# Patient Record
Sex: Male | Born: 1935 | Race: Black or African American | Hispanic: No | Marital: Married | Smoking: Former smoker
Health system: Southern US, Community
[De-identification: ages and names within clinical notes are randomized; demographics above are authoritative.]

## PROBLEM LIST (undated history)

## (undated) DIAGNOSIS — R1311 Dysphagia, oral phase: Secondary | ICD-10-CM

## (undated) DIAGNOSIS — N289 Disorder of kidney and ureter, unspecified: Secondary | ICD-10-CM

## (undated) DIAGNOSIS — K219 Gastro-esophageal reflux disease without esophagitis: Secondary | ICD-10-CM

## (undated) DIAGNOSIS — N400 Enlarged prostate without lower urinary tract symptoms: Secondary | ICD-10-CM

## (undated) DIAGNOSIS — E559 Vitamin D deficiency, unspecified: Secondary | ICD-10-CM

## (undated) DIAGNOSIS — K5900 Constipation, unspecified: Secondary | ICD-10-CM

## (undated) DIAGNOSIS — F329 Major depressive disorder, single episode, unspecified: Secondary | ICD-10-CM

## (undated) DIAGNOSIS — E785 Hyperlipidemia, unspecified: Secondary | ICD-10-CM

## (undated) DIAGNOSIS — G4700 Insomnia, unspecified: Secondary | ICD-10-CM

## (undated) DIAGNOSIS — M6281 Muscle weakness (generalized): Secondary | ICD-10-CM

## (undated) DIAGNOSIS — R52 Pain, unspecified: Secondary | ICD-10-CM

## (undated) DIAGNOSIS — M810 Age-related osteoporosis without current pathological fracture: Secondary | ICD-10-CM

---

## 2013-11-08 ENCOUNTER — Emergency Department: Payer: Self-pay | Admitting: Emergency Medicine

## 2013-11-08 LAB — CBC
HCT: 41.5 % (ref 40.0–52.0)
HGB: 13.7 g/dL (ref 13.0–18.0)
MCH: 30.5 pg (ref 26.0–34.0)
MCHC: 33 g/dL (ref 32.0–36.0)
MCV: 92 fL (ref 80–100)
Platelet: 175 10*3/uL (ref 150–440)
RBC: 4.49 10*6/uL (ref 4.40–5.90)
RDW: 13.4 % (ref 11.5–14.5)
WBC: 6.9 10*3/uL (ref 3.8–10.6)

## 2013-11-08 LAB — COMPREHENSIVE METABOLIC PANEL
ANION GAP: 3 — AB (ref 7–16)
Albumin: 3.9 g/dL (ref 3.4–5.0)
Alkaline Phosphatase: 121 U/L — ABNORMAL HIGH
BUN: 11 mg/dL (ref 7–18)
Bilirubin,Total: 0.6 mg/dL (ref 0.2–1.0)
CALCIUM: 8.9 mg/dL (ref 8.5–10.1)
CO2: 32 mmol/L (ref 21–32)
CREATININE: 0.98 mg/dL (ref 0.60–1.30)
Chloride: 102 mmol/L (ref 98–107)
EGFR (African American): >60
EGFR (Non-African Amer.): >60
GLUCOSE: 101 mg/dL — AB (ref 65–99)
Osmolality: 273 (ref 275–301)
POTASSIUM: 3.6 mmol/L (ref 3.5–5.1)
SGOT(AST): 29 U/L (ref 15–37)
SGPT (ALT): 26 U/L (ref 12–78)
Sodium: 137 mmol/L (ref 136–145)
TOTAL PROTEIN: 9 g/dL — AB (ref 6.4–8.2)

## 2013-11-08 LAB — URINALYSIS, COMPLETE
BLOOD: NEGATIVE
Bacteria: NONE SEEN
Bilirubin,UR: NEGATIVE
Glucose,UR: NEGATIVE mg/dL (ref 0–75)
KETONE: NEGATIVE
LEUKOCYTE ESTERASE: NEGATIVE
Nitrite: NEGATIVE
PH: 8 (ref 4.5–8.0)
PROTEIN: NEGATIVE
RBC,UR: 1 /HPF (ref 0–5)
Specific Gravity: 1.004 (ref 1.003–1.030)
Squamous Epithelial: NONE SEEN

## 2013-11-08 LAB — CK TOTAL AND CKMB (NOT AT ARMC)
CK, Total: 137 U/L
CK-MB: 2.1 ng/mL (ref 0.5–3.6)

## 2013-11-08 LAB — PRO B NATRIURETIC PEPTIDE: B-TYPE NATIURETIC PEPTID: 117 pg/mL (ref 0–450)

## 2013-11-08 LAB — TROPONIN I: Troponin-I: <0.02 ng/mL

## 2014-05-17 ENCOUNTER — Emergency Department: Payer: Self-pay | Admitting: Emergency Medicine

## 2014-05-17 LAB — BASIC METABOLIC PANEL
Anion Gap: 8 (ref 7–16)
BUN: 10 mg/dL (ref 7–18)
CREATININE: 0.96 mg/dL (ref 0.60–1.30)
Calcium, Total: 8.8 mg/dL (ref 8.5–10.1)
Chloride: 100 mmol/L (ref 98–107)
Co2: 32 mmol/L (ref 21–32)
EGFR (African American): >60
EGFR (Non-African Amer.): >60
Glucose: 93 mg/dL (ref 65–99)
Osmolality: 278 (ref 275–301)
POTASSIUM: 3.6 mmol/L (ref 3.5–5.1)
Sodium: 140 mmol/L (ref 136–145)

## 2014-05-17 LAB — TROPONIN I

## 2014-05-17 LAB — CBC
HCT: 39.9 % — ABNORMAL LOW (ref 40.0–52.0)
HGB: 12.5 g/dL — ABNORMAL LOW (ref 13.0–18.0)
MCH: 29.2 pg (ref 26.0–34.0)
MCHC: 31.3 g/dL — ABNORMAL LOW (ref 32.0–36.0)
MCV: 94 fL (ref 80–100)
Platelet: 168 10*3/uL (ref 150–440)
RBC: 4.27 10*6/uL — ABNORMAL LOW (ref 4.40–5.90)
RDW: 13.3 % (ref 11.5–14.5)
WBC: 6.3 10*3/uL (ref 3.8–10.6)

## 2014-06-20 ENCOUNTER — Emergency Department: Payer: Self-pay | Admitting: Emergency Medicine

## 2014-06-20 LAB — URINALYSIS, COMPLETE
BACTERIA: NONE SEEN
BLOOD: NEGATIVE
Bilirubin,UR: NEGATIVE
Glucose,UR: NEGATIVE mg/dL (ref 0–75)
KETONE: NEGATIVE
Leukocyte Esterase: NEGATIVE
Nitrite: NEGATIVE
PH: 7 (ref 4.5–8.0)
PROTEIN: NEGATIVE
RBC,UR: NONE SEEN /HPF (ref 0–5)
Specific Gravity: 1.006 (ref 1.003–1.030)
Squamous Epithelial: <1
WBC UR: 1 /HPF (ref 0–5)

## 2014-07-14 ENCOUNTER — Ambulatory Visit: Payer: Self-pay | Admitting: Podiatry

## 2014-07-21 ENCOUNTER — Ambulatory Visit: Payer: Self-pay | Admitting: Podiatry

## 2014-09-28 ENCOUNTER — Emergency Department: Payer: Self-pay | Admitting: Emergency Medicine

## 2014-11-05 ENCOUNTER — Emergency Department
Admit: 2014-11-05 | Disposition: A | Discharge status: Home / Self Care | Payer: Self-pay | Admitting: Emergency Medicine

## 2014-11-05 LAB — URINALYSIS, COMPLETE
BACTERIA: NONE SEEN
Bilirubin,UR: NEGATIVE
GLUCOSE, UR: NEGATIVE mg/dL (ref 0–75)
Ketone: NEGATIVE
Leukocyte Esterase: NEGATIVE
NITRITE: NEGATIVE
Ph: 7 (ref 4.5–8.0)
Protein: NEGATIVE
RBC,UR: <1 /HPF (ref 0–5)
SPECIFIC GRAVITY: 1.003 (ref 1.003–1.030)

## 2014-11-05 LAB — CBC
HCT: 38.6 % — ABNORMAL LOW (ref 40.0–52.0)
HGB: 12.4 g/dL — AB (ref 13.0–18.0)
MCH: 29.9 pg (ref 26.0–34.0)
MCHC: 32.2 g/dL (ref 32.0–36.0)
MCV: 93 fL (ref 80–100)
Platelet: 158 10*3/uL (ref 150–440)
RBC: 4.16 10*6/uL — AB (ref 4.40–5.90)
RDW: 13.1 % (ref 11.5–14.5)
WBC: 6.1 10*3/uL (ref 3.8–10.6)

## 2014-11-05 LAB — BASIC METABOLIC PANEL
ANION GAP: 6 — AB (ref 7–16)
BUN: 18 mg/dL
CHLORIDE: 99 mmol/L — AB
CREATININE: 1.11 mg/dL
Calcium, Total: 9.2 mg/dL
Co2: 31 mmol/L
EGFR (African American): >60
EGFR (Non-African Amer.): >60
GLUCOSE: 107 mg/dL — AB
Potassium: 3.8 mmol/L
Sodium: 136 mmol/L

## 2014-11-05 LAB — TSH: Thyroid Stimulating Horm: 1.505 u[IU]/mL

## 2014-11-05 LAB — TROPONIN I: Troponin-I: <0.03 ng/mL

## 2015-04-29 ENCOUNTER — Other Ambulatory Visit
Admission: RE | Admit: 2015-04-29 | Discharge: 2015-04-29 | Disposition: A | Discharge status: Home / Self Care | Payer: Medicare Other | Source: Ambulatory Visit | Attending: Nurse Practitioner | Admitting: Nurse Practitioner

## 2015-04-29 DIAGNOSIS — F39 Unspecified mood [affective] disorder: Secondary | ICD-10-CM | POA: Diagnosis present

## 2015-04-29 LAB — URINALYSIS COMPLETE WITH MICROSCOPIC (ARMC ONLY)
Bilirubin Urine: NEGATIVE
GLUCOSE, UA: NEGATIVE mg/dL
Hgb urine dipstick: NEGATIVE
LEUKOCYTES UA: NEGATIVE
Nitrite: NEGATIVE
Protein, ur: NEGATIVE mg/dL
SPECIFIC GRAVITY, URINE: 1.025 (ref 1.005–1.030)
pH: 6 (ref 5.0–8.0)

## 2015-05-01 LAB — URINE CULTURE: Culture: 80000

## 2015-10-05 ENCOUNTER — Ambulatory Visit
Admission: RE | Admit: 2015-10-05 | Discharge: 2015-10-05 | Disposition: A | Discharge status: Home / Self Care | Payer: Medicare Other | Source: Ambulatory Visit | Attending: Nurse Practitioner | Admitting: Nurse Practitioner

## 2015-10-05 ENCOUNTER — Other Ambulatory Visit
Admission: RE | Admit: 2015-10-05 | Discharge: 2015-10-05 | Disposition: A | Discharge status: Home / Self Care | Payer: Medicare Other | Source: Ambulatory Visit | Attending: Family Medicine | Admitting: Family Medicine

## 2015-10-05 ENCOUNTER — Other Ambulatory Visit: Payer: Self-pay | Admitting: Nurse Practitioner

## 2015-10-05 DIAGNOSIS — R109 Unspecified abdominal pain: Secondary | ICD-10-CM

## 2015-10-05 DIAGNOSIS — N200 Calculus of kidney: Secondary | ICD-10-CM | POA: Insufficient documentation

## 2015-10-05 DIAGNOSIS — K8020 Calculus of gallbladder without cholecystitis without obstruction: Secondary | ICD-10-CM | POA: Insufficient documentation

## 2015-10-05 DIAGNOSIS — K4090 Unilateral inguinal hernia, without obstruction or gangrene, not specified as recurrent: Secondary | ICD-10-CM | POA: Diagnosis not present

## 2015-10-05 LAB — BASIC METABOLIC PANEL
ANION GAP: 6 (ref 5–15)
BUN: 26 mg/dL — AB (ref 6–20)
CHLORIDE: 98 mmol/L — AB (ref 101–111)
CO2: 32 mmol/L (ref 22–32)
CREATININE: 1.41 mg/dL — AB (ref 0.61–1.24)
Calcium: 8.8 mg/dL — ABNORMAL LOW (ref 8.9–10.3)
GFR calc Af Amer: 50 mL/min — ABNORMAL LOW (ref >60)
GFR calc non Af Amer: 43 mL/min — ABNORMAL LOW (ref >60)
Glucose, Bld: 93 mg/dL (ref 65–99)
POTASSIUM: 4.4 mmol/L (ref 3.5–5.1)
Sodium: 136 mmol/L (ref 135–145)

## 2015-10-05 LAB — CBC WITH DIFFERENTIAL/PLATELET
BASOS ABS: 0 10*3/uL (ref 0–0.1)
Basophils Relative: 1 %
Eosinophils Absolute: 0.1 10*3/uL (ref 0–0.7)
Eosinophils Relative: 2 %
HEMATOCRIT: 36.7 % — AB (ref 40.0–52.0)
Hemoglobin: 12.1 g/dL — ABNORMAL LOW (ref 13.0–18.0)
LYMPHS ABS: 1.5 10*3/uL (ref 1.0–3.6)
LYMPHS PCT: 25 %
MCH: 29.9 pg (ref 26.0–34.0)
MCHC: 32.8 g/dL (ref 32.0–36.0)
MCV: 91 fL (ref 80.0–100.0)
Monocytes Absolute: 0.6 10*3/uL (ref 0.2–1.0)
Monocytes Relative: 10 %
NEUTROS ABS: 3.9 10*3/uL (ref 1.4–6.5)
Neutrophils Relative %: 62 %
Platelets: 155 10*3/uL (ref 150–440)
RBC: 4.04 MIL/uL — AB (ref 4.40–5.90)
RDW: 12.8 % (ref 11.5–14.5)
WBC: 6.2 10*3/uL (ref 3.8–10.6)

## 2015-10-08 ENCOUNTER — Other Ambulatory Visit
Admission: RE | Admit: 2015-10-08 | Discharge: 2015-10-08 | Disposition: A | Discharge status: Home / Self Care | Payer: Medicare Other | Source: Ambulatory Visit | Attending: Family Medicine | Admitting: Family Medicine

## 2015-10-08 DIAGNOSIS — N179 Acute kidney failure, unspecified: Secondary | ICD-10-CM | POA: Diagnosis present

## 2015-10-08 DIAGNOSIS — G301 Alzheimer's disease with late onset: Secondary | ICD-10-CM | POA: Insufficient documentation

## 2015-10-08 DIAGNOSIS — I214 Non-ST elevation (NSTEMI) myocardial infarction: Secondary | ICD-10-CM | POA: Diagnosis present

## 2015-10-08 DIAGNOSIS — I10 Essential (primary) hypertension: Secondary | ICD-10-CM | POA: Insufficient documentation

## 2015-10-08 LAB — URINALYSIS COMPLETE WITH MICROSCOPIC (ARMC ONLY)
BILIRUBIN URINE: NEGATIVE
Bacteria, UA: NONE SEEN
Glucose, UA: NEGATIVE mg/dL
HGB URINE DIPSTICK: NEGATIVE
KETONES UR: NEGATIVE mg/dL
Leukocytes, UA: NEGATIVE
NITRITE: NEGATIVE
PROTEIN: NEGATIVE mg/dL
Specific Gravity, Urine: 1.009 (ref 1.005–1.030)
Squamous Epithelial / LPF: NONE SEEN
pH: 5 (ref 5.0–8.0)

## 2015-10-10 LAB — URINE CULTURE

## 2015-12-16 ENCOUNTER — Other Ambulatory Visit
Admission: RE | Admit: 2015-12-16 | Discharge: 2015-12-16 | Disposition: A | Discharge status: Home / Self Care | Payer: Medicare Other | Source: Ambulatory Visit | Attending: Family Medicine | Admitting: Family Medicine

## 2015-12-16 DIAGNOSIS — N179 Acute kidney failure, unspecified: Secondary | ICD-10-CM | POA: Insufficient documentation

## 2015-12-16 DIAGNOSIS — I214 Non-ST elevation (NSTEMI) myocardial infarction: Secondary | ICD-10-CM | POA: Insufficient documentation

## 2015-12-16 DIAGNOSIS — I10 Essential (primary) hypertension: Secondary | ICD-10-CM | POA: Diagnosis present

## 2015-12-16 DIAGNOSIS — G301 Alzheimer's disease with late onset: Secondary | ICD-10-CM | POA: Insufficient documentation

## 2015-12-16 LAB — TROPONIN I

## 2015-12-16 LAB — CBC WITH DIFFERENTIAL/PLATELET
BASOS ABS: 0 10*3/uL (ref 0–0.1)
BASOS PCT: 0 %
EOS ABS: 0.4 10*3/uL (ref 0–0.7)
EOS PCT: 6 %
HCT: 31.6 % — ABNORMAL LOW (ref 40.0–52.0)
Hemoglobin: 10.4 g/dL — ABNORMAL LOW (ref 13.0–18.0)
LYMPHS PCT: 23 %
Lymphs Abs: 1.7 10*3/uL (ref 1.0–3.6)
MCH: 30.3 pg (ref 26.0–34.0)
MCHC: 32.8 g/dL (ref 32.0–36.0)
MCV: 92.4 fL (ref 80.0–100.0)
MONO ABS: 0.8 10*3/uL (ref 0.2–1.0)
Monocytes Relative: 11 %
Neutro Abs: 4.5 10*3/uL (ref 1.4–6.5)
Neutrophils Relative %: 60 %
PLATELETS: 148 10*3/uL — AB (ref 150–440)
RBC: 3.42 MIL/uL — AB (ref 4.40–5.90)
RDW: 12.8 % (ref 11.5–14.5)
WBC: 7.4 10*3/uL (ref 3.8–10.6)

## 2015-12-16 LAB — URINALYSIS COMPLETE WITH MICROSCOPIC (ARMC ONLY)
Bilirubin Urine: NEGATIVE
GLUCOSE, UA: NEGATIVE mg/dL
KETONES UR: NEGATIVE mg/dL
Leukocytes, UA: NEGATIVE
Nitrite: NEGATIVE
PROTEIN: NEGATIVE mg/dL
SPECIFIC GRAVITY, URINE: 1.011 (ref 1.005–1.030)
WBC, UA: NONE SEEN WBC/hpf (ref 0–5)
pH: 5 (ref 5.0–8.0)

## 2015-12-16 LAB — BASIC METABOLIC PANEL
ANION GAP: 9 (ref 5–15)
BUN: 85 mg/dL — ABNORMAL HIGH (ref 6–20)
CHLORIDE: 101 mmol/L (ref 101–111)
CO2: 27 mmol/L (ref 22–32)
Calcium: 8.6 mg/dL — ABNORMAL LOW (ref 8.9–10.3)
Creatinine, Ser: 3.19 mg/dL — ABNORMAL HIGH (ref 0.61–1.24)
GFR calc Af Amer: 19 mL/min — ABNORMAL LOW (ref >60)
GFR, EST NON AFRICAN AMERICAN: 16 mL/min — AB (ref >60)
GLUCOSE: 112 mg/dL — AB (ref 65–99)
POTASSIUM: 4.4 mmol/L (ref 3.5–5.1)
Sodium: 137 mmol/L (ref 135–145)

## 2015-12-16 LAB — BRAIN NATRIURETIC PEPTIDE: B NATRIURETIC PEPTIDE 5: 88 pg/mL (ref 0.0–100.0)

## 2015-12-16 LAB — CKMB (ARMC ONLY): CK, MB: 6.4 ng/mL — AB (ref 0.5–5.0)

## 2015-12-18 LAB — URINE CULTURE: Culture: NO GROWTH

## 2016-03-10 ENCOUNTER — Other Ambulatory Visit
Admission: RE | Admit: 2016-03-10 | Discharge: 2016-03-10 | Disposition: A | Discharge status: Home / Self Care | Payer: Medicare Other | Source: Ambulatory Visit | Attending: Family Medicine | Admitting: Family Medicine

## 2016-03-10 DIAGNOSIS — G301 Alzheimer's disease with late onset: Secondary | ICD-10-CM | POA: Insufficient documentation

## 2016-03-10 DIAGNOSIS — I214 Non-ST elevation (NSTEMI) myocardial infarction: Secondary | ICD-10-CM | POA: Insufficient documentation

## 2016-03-10 DIAGNOSIS — N179 Acute kidney failure, unspecified: Secondary | ICD-10-CM | POA: Insufficient documentation

## 2016-03-10 DIAGNOSIS — I10 Essential (primary) hypertension: Secondary | ICD-10-CM | POA: Insufficient documentation

## 2016-03-10 LAB — BASIC METABOLIC PANEL
ANION GAP: 9 (ref 5–15)
BUN: 23 mg/dL — ABNORMAL HIGH (ref 6–20)
CO2: 27 mmol/L (ref 22–32)
Calcium: 8.8 mg/dL — ABNORMAL LOW (ref 8.9–10.3)
Chloride: 101 mmol/L (ref 101–111)
Creatinine, Ser: 1.36 mg/dL — ABNORMAL HIGH (ref 0.61–1.24)
GFR, EST AFRICAN AMERICAN: 52 mL/min — AB (ref >60)
GFR, EST NON AFRICAN AMERICAN: 45 mL/min — AB (ref >60)
Glucose, Bld: 116 mg/dL — ABNORMAL HIGH (ref 65–99)
POTASSIUM: 4.1 mmol/L (ref 3.5–5.1)
SODIUM: 137 mmol/L (ref 135–145)

## 2016-03-10 LAB — CBC WITH DIFFERENTIAL/PLATELET
BASOS ABS: 0 10*3/uL (ref 0–0.1)
Basophils Relative: 1 %
EOS ABS: 0.1 10*3/uL (ref 0–0.7)
EOS PCT: 1 %
HCT: 31.2 % — ABNORMAL LOW (ref 40.0–52.0)
Hemoglobin: 10.8 g/dL — ABNORMAL LOW (ref 13.0–18.0)
LYMPHS PCT: 20 %
Lymphs Abs: 1.9 10*3/uL (ref 1.0–3.6)
MCH: 31.8 pg (ref 26.0–34.0)
MCHC: 34.5 g/dL (ref 32.0–36.0)
MCV: 92.1 fL (ref 80.0–100.0)
Monocytes Absolute: 0.9 10*3/uL (ref 0.2–1.0)
Monocytes Relative: 10 %
Neutro Abs: 6.6 10*3/uL — ABNORMAL HIGH (ref 1.4–6.5)
Neutrophils Relative %: 68 %
PLATELETS: 150 10*3/uL (ref 150–440)
RBC: 3.39 MIL/uL — AB (ref 4.40–5.90)
RDW: 13.1 % (ref 11.5–14.5)
WBC: 9.5 10*3/uL (ref 3.8–10.6)

## 2016-05-19 ENCOUNTER — Other Ambulatory Visit
Admission: RE | Admit: 2016-05-19 | Discharge: 2016-05-19 | Disposition: A | Discharge status: Home / Self Care | Payer: Medicare Other | Source: Ambulatory Visit | Attending: Family Medicine | Admitting: Family Medicine

## 2016-05-19 DIAGNOSIS — N390 Urinary tract infection, site not specified: Secondary | ICD-10-CM | POA: Insufficient documentation

## 2016-05-19 LAB — URINALYSIS COMPLETE WITH MICROSCOPIC (ARMC ONLY)
BACTERIA UA: NONE SEEN
Bilirubin Urine: NEGATIVE
GLUCOSE, UA: NEGATIVE mg/dL
Hgb urine dipstick: NEGATIVE
Ketones, ur: NEGATIVE mg/dL
Leukocytes, UA: NEGATIVE
Nitrite: NEGATIVE
PROTEIN: NEGATIVE mg/dL
Specific Gravity, Urine: 1.012 (ref 1.005–1.030)
Squamous Epithelial / LPF: NONE SEEN
pH: 7 (ref 5.0–8.0)

## 2016-05-20 LAB — URINE CULTURE: CULTURE: NO GROWTH

## 2016-08-20 ENCOUNTER — Ambulatory Visit: Payer: Self-pay | Admitting: Urology

## 2016-09-03 ENCOUNTER — Ambulatory Visit (INDEPENDENT_AMBULATORY_CARE_PROVIDER_SITE_OTHER): Payer: Medicare Other | Admitting: Urology

## 2016-09-03 VITALS — Ht 69.0 in | Wt 214.9 lb

## 2016-09-03 DIAGNOSIS — R350 Frequency of micturition: Secondary | ICD-10-CM

## 2016-09-03 LAB — BLADDER SCAN AMB NON-IMAGING: Scan Result: 42

## 2016-09-03 MED ORDER — MIRABEGRON ER 50 MG PO TB24: 50.0000 mg | ORAL_TABLET | Freq: Every day | ORAL | 11 refills | Status: DC

## 2016-09-03 NOTE — Progress Notes (Signed)
09/03/2016 2:52 PM   Jose Allen 1925/11/06 [id number]  Referring provider: Keane Police, MD [id number] [place], [place] [address]  Chief Complaint  Patient presents with  . New Patient (Initial Visit)    urinary frequency    HPI: 83 year old African-American male who presents today from a nursing facility with a past medical history of dementia who is seen today for further evaluation of overactive bladder and urinary incontinence.  The patient states that he has a strong stream, but has a very frequent bladder. He is unable to resist the urges. He uses about 2 or 3 depends per day. He also has associated fecal incontinence. He has no history of urinary tract infections. He does have a history of BPH, he had a previous urologist and was taking Terazosin and finasteride. His tears as it has been discontinued, he continues on Avodart.     PMH: No past medical history on file.  Surgical History: No past surgical history on file.  Home Medications:  Allergies as of 09/03/2016      Reactions   Terazosin    Other reaction(s): Other (See Comments) NEAR CARDIAC ARREST      Medication List       Accurate as of 09/03/16  2:52 PM. Always use your most recent med list.          acetaminophen 325 MG tablet Commonly known as:  TYLENOL Take by mouth.   aspirin EC 81 MG tablet Take by mouth.   carvedilol 12.5 MG tablet Commonly known as:  COREG Take by mouth.   escitalopram 5 MG tablet Commonly known as:  LEXAPRO Take by mouth.   isosorbide mononitrate 60 MG 24 hr tablet Commonly known as:  IMDUR Take by mouth.   lactulose 10 GM/15ML solution Commonly known as:  CHRONULAC Take by mouth.   mirabegron ER 50 MG Tb24 tablet Commonly known as:  MYRBETRIQ Take 1 tablet (50 mg total) by mouth daily.   torsemide 20 MG tablet Commonly known as:  DEMADEX Take by mouth.   VITAMIN D-1000 MAX ST 1000 units tablet Generic drug:   Cholecalciferol Take by mouth.       Allergies:  Allergies  Allergen Reactions  . Terazosin     Other reaction(s): Other (See Comments) NEAR CARDIAC ARREST    Family History: No family history on file.  Social History:  has no tobacco, alcohol, and drug history on file.  ROS: UROLOGY Frequent Urination?: Yes Hard to postpone urination?: Yes Burning/pain with urination?: No Get up Allen night to urinate?: Yes Leakage of urine?: Yes Urine stream starts and stops?: No Trouble starting stream?: No Do you have to strain to urinate?: No Blood in urine?: No Urinary tract infection?: No Sexually transmitted disease?: No Injury to kidneys or bladder?: No Painful intercourse?: No Weak stream?: No Erection problems?: No Penile pain?: No                                      Physical Exam: Ht 5\' 9"  (1.753 m)   Wt 97.5 kg (214 lb 14.4 oz)   BMI 31.74 kg/m   Constitutional:  Alert and oriented, No acute distress. HEENT: Jose Allen, moist mucus membranes.  Trachea midline, no masses. Cardiovascular: No clubbing, cyanosis, or edema. Respiratory: Normal respiratory effort, no increased work of breathing. GI: Abdomen is soft, nontender, nondistended, no abdominal masses Skin: No rashes, bruises or  suspicious lesions. Lymph: No cervical or inguinal adenopathy. Neurologic: Grossly intact, no focal deficits, moving all 4 extremities. Psychiatric: Normal mood and affect.  Laboratory Data: Lab Results  Component Value Date   WBC 9.5 03/10/2016   HGB 10.8 (L) 03/10/2016   HCT 31.2 (L) 03/10/2016   MCV 92.1 03/10/2016   PLT 150 03/10/2016    Lab Results  Component Value Date   CREATININE 1.36 (H) 03/10/2016    No results found for: PSA  No results found for: TESTOSTERONE  No results found for: HGBA1C  Urinalysis    Component Value Date/Time   COLORURINE YELLOW (A) 05/19/2016 0800   APPEARANCEUR CLEAR (A) 05/19/2016 0800   APPEARANCEUR Clear  11/05/2014 0812   LABSPEC 1.012 05/19/2016 0800   LABSPEC 1.003 11/05/2014 0812   PHURINE 7.0 05/19/2016 0800   GLUCOSEU NEGATIVE 05/19/2016 0800   GLUCOSEU Negative 11/05/2014 0812   HGBUR NEGATIVE 05/19/2016 0800   BILIRUBINUR NEGATIVE 05/19/2016 0800   BILIRUBINUR Negative 11/05/2014 0812   KETONESUR NEGATIVE 05/19/2016 0800   PROTEINUR NEGATIVE 05/19/2016 0800   NITRITE NEGATIVE 05/19/2016 0800   LEUKOCYTESUR NEGATIVE 05/19/2016 0800   LEUKOCYTESUR Negative 11/05/2014 16100812    Pertinent Imaging: PVR: 48 mL's  Assessment & Plan:  The patient has overactive bladder with associated urge urinary incontinence. This is certainly a multifactorial problem compounded by his diuretics as well as his dementia.  1. Urinary frequency I recommended that the patient be placed on a timed voiding schedule of every 4 hours. I also encouraged the patient dictated recorder. Further, we have started the patient on myrbetriq 50 mg daily. He will follow up with us in 1 month to ensure that his symptoms have improved.  - Urinalysis, Complete - Bladder Scan (Post Void Residual) in office   No Follow-up on file.  Crist FatHERRICK, Jose Seda W, MD  [facility] [address], [address] [place], [place] [address] [phone]

## 2016-09-04 LAB — URINALYSIS, COMPLETE
BILIRUBIN UA: NEGATIVE
Glucose, UA: NEGATIVE
KETONES UA: NEGATIVE
Leukocytes, UA: NEGATIVE
NITRITE UA: NEGATIVE
Protein, UA: NEGATIVE
SPEC GRAV UA: 1.01 (ref 1.005–1.030)
Urobilinogen, Ur: 0.2 mg/dL (ref 0.2–1.0)
pH, UA: 6.5 (ref 5.0–7.5)

## 2016-09-04 LAB — MICROSCOPIC EXAMINATION
Bacteria, UA: NONE SEEN
Epithelial Cells (non renal): NONE SEEN /hpf (ref 0–10)
WBC UA: NONE SEEN /HPF (ref 0–?)

## 2016-09-24 ENCOUNTER — Ambulatory Visit: Payer: Medicare Other | Admitting: Podiatry

## 2016-10-01 ENCOUNTER — Ambulatory Visit: Payer: Self-pay

## 2016-10-01 ENCOUNTER — Ambulatory Visit: Payer: Self-pay | Admitting: Urology

## 2016-10-08 ENCOUNTER — Ambulatory Visit: Payer: Medicare Other | Admitting: Podiatry

## 2016-10-15 ENCOUNTER — Ambulatory Visit: Payer: Self-pay

## 2016-10-29 ENCOUNTER — Ambulatory Visit: Payer: Medicare Other | Admitting: Podiatry

## 2016-10-30 ENCOUNTER — Ambulatory Visit: Payer: Medicare Other | Admitting: Podiatry

## 2016-11-18 ENCOUNTER — Ambulatory Visit: Payer: Medicare Other | Admitting: Podiatry

## 2017-01-14 ENCOUNTER — Emergency Department: Payer: Medicare Other

## 2017-01-14 ENCOUNTER — Emergency Department
Admission: EM | Admit: 2017-01-14 | Discharge: 2017-01-14 | Disposition: A | Discharge status: Skilled Nursing Facility | Payer: Medicare Other | Attending: Emergency Medicine | Admitting: Emergency Medicine

## 2017-01-14 ENCOUNTER — Encounter: Payer: Self-pay | Admitting: Intensive Care

## 2017-01-14 DIAGNOSIS — W1830XA Fall on same level, unspecified, initial encounter: Secondary | ICD-10-CM | POA: Insufficient documentation

## 2017-01-14 DIAGNOSIS — Y939 Activity, unspecified: Secondary | ICD-10-CM | POA: Insufficient documentation

## 2017-01-14 DIAGNOSIS — Z87891 Personal history of nicotine dependence: Secondary | ICD-10-CM | POA: Diagnosis not present

## 2017-01-14 DIAGNOSIS — W19XXXA Unspecified fall, initial encounter: Secondary | ICD-10-CM

## 2017-01-14 DIAGNOSIS — Y999 Unspecified external cause status: Secondary | ICD-10-CM | POA: Diagnosis not present

## 2017-01-14 DIAGNOSIS — Z79899 Other long term (current) drug therapy: Secondary | ICD-10-CM | POA: Insufficient documentation

## 2017-01-14 DIAGNOSIS — Y92121 Bathroom in nursing home as the place of occurrence of the external cause: Secondary | ICD-10-CM | POA: Diagnosis not present

## 2017-01-14 DIAGNOSIS — R079 Chest pain, unspecified: Secondary | ICD-10-CM | POA: Diagnosis not present

## 2017-01-14 DIAGNOSIS — S0990XA Unspecified injury of head, initial encounter: Secondary | ICD-10-CM | POA: Insufficient documentation

## 2017-01-14 HISTORY — DX: Benign prostatic hyperplasia without lower urinary tract symptoms: N40.0

## 2017-01-14 HISTORY — DX: Disorder of kidney and ureter, unspecified: N28.9

## 2017-01-14 HISTORY — DX: Gastro-esophageal reflux disease without esophagitis: K21.9

## 2017-01-14 HISTORY — DX: Dysphagia, oral phase: R13.11

## 2017-01-14 HISTORY — DX: Hyperlipidemia, unspecified: E78.5

## 2017-01-14 HISTORY — DX: Vitamin D deficiency, unspecified: E55.9

## 2017-01-14 HISTORY — DX: Major depressive disorder, single episode, unspecified: F32.9

## 2017-01-14 HISTORY — DX: Insomnia, unspecified: G47.00

## 2017-01-14 HISTORY — DX: Pain, unspecified: R52

## 2017-01-14 HISTORY — DX: Muscle weakness (generalized): M62.81

## 2017-01-14 HISTORY — DX: Age-related osteoporosis without current pathological fracture: M81.0

## 2017-01-14 HISTORY — DX: Constipation, unspecified: K59.00

## 2017-01-14 LAB — CBC WITH DIFFERENTIAL/PLATELET
BASOS ABS: 0.1 10*3/uL (ref 0–0.1)
Basophils Relative: 1 %
Eosinophils Absolute: 0.2 10*3/uL (ref 0–0.7)
Eosinophils Relative: 3 %
HCT: 31.2 % — ABNORMAL LOW (ref 40.0–52.0)
HEMOGLOBIN: 10.3 g/dL — AB (ref 13.0–18.0)
LYMPHS PCT: 17 %
Lymphs Abs: 1.5 10*3/uL (ref 1.0–3.6)
MCH: 30.2 pg (ref 26.0–34.0)
MCHC: 32.8 g/dL (ref 32.0–36.0)
MCV: 92.1 fL (ref 80.0–100.0)
MONOS PCT: 10 %
Monocytes Absolute: 0.9 10*3/uL (ref 0.2–1.0)
NEUTROS ABS: 6 10*3/uL (ref 1.4–6.5)
Neutrophils Relative %: 69 %
Platelets: 174 10*3/uL (ref 150–440)
RBC: 3.39 MIL/uL — ABNORMAL LOW (ref 4.40–5.90)
RDW: 13.4 % (ref 11.5–14.5)
WBC: 8.7 10*3/uL (ref 3.8–10.6)

## 2017-01-14 LAB — COMPREHENSIVE METABOLIC PANEL
ALT: 17 U/L (ref 17–63)
AST: 22 U/L (ref 15–41)
Albumin: 3.1 g/dL — ABNORMAL LOW (ref 3.5–5.0)
Alkaline Phosphatase: 51 U/L (ref 38–126)
Anion gap: 8 (ref 5–15)
BILIRUBIN TOTAL: 0.7 mg/dL (ref 0.3–1.2)
BUN: 25 mg/dL — AB (ref 6–20)
CO2: 33 mmol/L — ABNORMAL HIGH (ref 22–32)
Calcium: 8.7 mg/dL — ABNORMAL LOW (ref 8.9–10.3)
Chloride: 97 mmol/L — ABNORMAL LOW (ref 101–111)
Creatinine, Ser: 1.3 mg/dL — ABNORMAL HIGH (ref 0.61–1.24)
GFR, EST AFRICAN AMERICAN: 54 mL/min — AB (ref >60)
GFR, EST NON AFRICAN AMERICAN: 47 mL/min — AB (ref >60)
Glucose, Bld: 118 mg/dL — ABNORMAL HIGH (ref 65–99)
POTASSIUM: 3.8 mmol/L (ref 3.5–5.1)
Sodium: 138 mmol/L (ref 135–145)
TOTAL PROTEIN: 6.7 g/dL (ref 6.5–8.1)

## 2017-01-14 LAB — TROPONIN I

## 2017-01-14 NOTE — ED Provider Notes (Signed)
[facility] Emergency Department Provider Note   ____________________________________________    I have reviewed the triage vital signs and the nursing notes.   HISTORY  Chief Complaint Fall  History limited by dementia   HPI Jose Allen is a 83 y.o. male who presents after an unwitnessed fall. Patient reportedly made his way into the employee bathroom at [facility] and fell, staff had to remove the door to get to him because he had locked himself in. Apparently he complained of chest pain briefly but none now. He states he feels well and has no complaints currently. Family reports abrasion to his head but no blood thinners.   Past Medical History:  Diagnosis Date  . Age-related osteoporosis without current pathological fracture   . Benign prostatic hyperplasia without lower urinary tract symptoms   . Constipation   . Dysphagia, oral phase   . Gastro-esophageal reflux disease without esophagitis   . Hyperlipidemia   . Insomnia, unspecified   . Major depressive disorder, single episode, unspecified   . Muscle weakness (generalized)   . Pain, unspecified   . Renal disorder   . Vitamin D deficiency, unspecified     There are no active problems to display for this patient.   History reviewed. No pertinent surgical history.  Prior to Admission medications   Medication Sig Start Date End Date Taking? Authorizing Provider  acetaminophen (TYLENOL) 325 MG tablet Take 650 mg by mouth every 8 (eight) hours as needed for mild pain, moderate pain or fever.    Yes [provider]  acetaminophen (TYLENOL) 500 MG tablet Take 1,000 mg by mouth 2 (two) times daily.   Yes [provider]  carvedilol (COREG) 25 MG tablet Take 25 mg by mouth 2 (two) times daily.   Yes [provider]  cholecalciferol (VITAMIN D) 1000 units tablet Take 1,000 Units by mouth daily.   Yes [provider]  cloNIDine (CATAPRES) 0.1 MG  tablet Take 0.1 mg by mouth daily as needed. For bp >170.   Yes [provider]  escitalopram (LEXAPRO) 10 MG tablet Take 10 mg by mouth daily.   Yes [provider]  finasteride (PROSCAR) 5 MG tablet Take 5 mg by mouth daily.   Yes [provider]  isosorbide mononitrate (IMDUR) 60 MG 24 hr tablet Take 90 mg by mouth daily.   Yes [provider]  lactulose (CHRONULAC) 10 GM/15ML solution Take 20 g by mouth every other day.    Yes [provider]  mineral oil-hydrophilic petrolatum (AQUAPHOR) ointment Apply 1 application topically at bedtime. Apply to bilateral lower legs and feet   Yes [provider]  mirabegron ER (MYRBETRIQ) 50 MG TB24 tablet Take 1 tablet (50 mg total) by mouth daily. 09/03/16  Yes Crist Fat, MD  polyethylene glycol [facility] / Ethelene Hal) packet Take 17 g by mouth daily.   Yes [provider]  torsemide (DEMADEX) 20 MG tablet Take 60 mg by mouth daily.    Yes [provider]     Allergies Terazosin; Enalapril; Flomax [tamsulosin hcl]; Hydrochlorothiazide; and Metoprolol  History reviewed. No pertinent family history.  Social History Social History  Substance Use Topics  . Smoking status: Former Smoker    Types: Pipe  . Smokeless tobacco: Never Used  . Alcohol use Not on file    Review of Systems  Constitutional: No fever/chills Eyes: No visual changes.  ENT: No neck pain Cardiovascular: Denies chest pain. Respiratory: Denies shortness  of breath. Gastrointestinal: No abdominal pain.  No nausea, no vomiting.   Genitourinary: Negative for dysuria. Musculoskeletal: Negative for back pain. Skin:abrasion to head Neurological: Negative for headaches or weakness   ____________________________________________   PHYSICAL EXAM:  VITAL SIGNS: ED Triage Vitals  Enc Vitals Group     BP 01/14/17 1409 (!) 141/72     Pulse Rate 01/14/17 1409 (!) 53     Resp 01/14/17 1409 12     Temp  01/14/17 1409 97.5 F (36.4 C)     Temp Source 01/14/17 1409 Oral     SpO2 01/14/17 1409 96 %     Weight 01/14/17 1410 102.1 kg (225 lb)     Height 01/14/17 1410 1.676 m (5\' 6" )     Head Circumference --      Peak Flow --      Pain Score --      Pain Loc --      Pain Edu? --      Excl. in GC? --     Constitutional: Alert. No acute distress. Pleasant and interactive Eyes: Conjunctivae are normal.  Head: Atraumatic. Nose: No congestion/rhinnorhea. Mouth/Throat: Mucous membranes are moist.   Neck:  Painless ROM, no vertebral tenderness to palpation,no pain with axial load Cardiovascular: Normal rate, regular rhythm. Grossly normal heart sounds.  Good peripheral circulation. Respiratory: Normal respiratory effort.  No retractions. Lungs CTAB. Gastrointestinal: Soft and nontender. No distention.  No CVA tenderness. Genitourinary: deferred Musculoskeletal: No lower extremity tenderness nor edema.  Warm and well perfused. Full range of motion with normal strength. No pain with axial load on either hip. No tenderness to palpation of the pelvis Neurologic:  Normal speech and language. No gross focal neurologic deficits are appreciated.  Skin:  Skin is warm, dry and intact. No rash noted. Psychiatric: Mood and affect are normal. Speech and behavior are normal.  ____________________________________________   LABS (all labs ordered are listed, but only abnormal results are displayed)  Labs Reviewed  CBC WITH DIFFERENTIAL/PLATELET - Abnormal; Notable for the following:       Result Value   RBC 3.39 (*)    Hemoglobin 10.3 (*)    HCT 31.2 (*)    All other components within normal limits  TROPONIN I  COMPREHENSIVE METABOLIC PANEL   ____________________________________________  EKG  ED ECG REPORT I, Jene Every, the attending physician, personally viewed and interpreted this ECG.  Date: 01/14/2017  Rate: 54 Rhythm: sinus bradycardia QRS Axis: normal Intervals: normal ST/T  Wave abnormalities: normal Narrative Interpretation: unremarkable  ____________________________________________  RADIOLOGY  CT head pending ____________________________________________   PROCEDURES  Procedure(s) performed: No    Critical Care performed: No ____________________________________________   INITIAL IMPRESSION / ASSESSMENT AND PLAN / ED COURSE  Pertinent labs & imaging results that were available during my care of the patient were reviewed by me and considered in my medical decision making (see chart for details).  Patient well-appearing and in no acute distress. No evidence of acute injury besides mild abrasion to the scalp. We will obtain images, labs.  ----------------------------------------- 3:22 PM on 01/14/2017 -----------------------------------------  Labs thus far are normal, pending CMP and CT head.  I have asked Dr. Lamont Snowball to f/u on CT and CMP, if normal patient appropriate for discharge.    ____________________________________________   FINAL CLINICAL IMPRESSION(S) / ED DIAGNOSES  Final diagnoses:  Minor head injury, initial encounter  Fall, initial encounter      NEW MEDICATIONS STARTED DURING THIS VISIT:  New Prescriptions  No medications on file     Note:  This document was prepared using Dragon voice recognition software and may include unintentional dictation errors.    Jene EveryKinner, Berneta Sconyers, MD 01/14/17 1524

## 2017-01-14 NOTE — Discharge Instructions (Signed)
Please follow-up with your primary care physician as needed and return to the emergency department for any concerns.  It was a pleasure to take care of you today, and thank you for coming to our emergency department.  If you have any questions or concerns before leaving please ask the nurse to grab me and I'm more than happy to go through your aftercare instructions again.  If you were prescribed any opioid pain medication today such as Norco, Vicodin, Percocet, morphine, hydrocodone, or oxycodone please make sure you do not drive when you are taking this medication as it can alter your ability to drive safely.  If you have any concerns once you are home that you are not improving or are in fact getting worse before you can make it to your follow-up appointment, please do not hesitate to call [phone] and come back for further evaluation.  Merrily Brittle MD  Results for orders placed or performed during the hospital encounter of 01/14/17  Troponin I  Result Value Ref Range   Troponin I <0.03 <0.03 ng/mL  CBC with Differential  Result Value Ref Range   WBC 8.7 3.8 - 10.6 K/uL   RBC 3.39 (L) 4.40 - 5.90 MIL/uL   Hemoglobin 10.3 (L) 13.0 - 18.0 g/dL   HCT 16.1 (L) 09.6 - 04.5 %   MCV 92.1 80.0 - 100.0 fL   MCH 30.2 26.0 - 34.0 pg   MCHC 32.8 32.0 - 36.0 g/dL   RDW 40.9 81.1 - 91.4 %   Platelets 174 150 - 440 K/uL   Neutrophils Relative % 69 %   Neutro Abs 6.0 1.4 - 6.5 K/uL   Lymphocytes Relative 17 %   Lymphs Abs 1.5 1.0 - 3.6 K/uL   Monocytes Relative 10 %   Monocytes Absolute 0.9 0.2 - 1.0 K/uL   Eosinophils Relative 3 %   Eosinophils Absolute 0.2 0 - 0.7 K/uL   Basophils Relative 1 %   Basophils Absolute 0.1 0 - 0.1 K/uL  Comprehensive metabolic panel  Result Value Ref Range   Sodium 138 135 - 145 mmol/L   Potassium 3.8 3.5 - 5.1 mmol/L   Chloride 97 (L) 101 - 111 mmol/L   CO2 33 (H) 22 - 32 mmol/L   Glucose, Bld 118 (H) 65 - 99 mg/dL   BUN 25 (H) 6 - 20 mg/dL   Creatinine, Ser  7.82 (H) 0.61 - 1.24 mg/dL   Calcium 8.7 (L) 8.9 - 10.3 mg/dL   Total Protein 6.7 6.5 - 8.1 g/dL   Albumin 3.1 (L) 3.5 - 5.0 g/dL   AST 22 15 - 41 U/L   ALT 17 17 - 63 U/L   Alkaline Phosphatase 51 38 - 126 U/L   Total Bilirubin 0.7 0.3 - 1.2 mg/dL   GFR calc non Af Amer 47 (L) >60 mL/min   GFR calc Af Amer 54 (L) >60 mL/min   Anion gap 8 5 - 15   Ct Head Wo Contrast  Result Date: 01/14/2017 CLINICAL DATA:  Unwitnessed fall in the bathroom. Abrasion to the back of the head. EXAM: CT HEAD WITHOUT CONTRAST TECHNIQUE: Contiguous axial images were obtained from the base of the skull through the vertex without intravenous contrast. COMPARISON:  11/08/2013 FINDINGS: Brain: There is generalized brain atrophy. There is an old left parietal vertex cortical and subcortical infarction. There chronic small-vessel ischemic changes of the hemispheric deep white matter. No sign of acute infarction, mass lesion, hemorrhage, hydrocephalus or extra-axial collection. Vascular: No  abnormal vascular finding. Skull: No fracture or focal lesion. Sinuses/Orbits: Clear/ normal Other: None significant IMPRESSION: No acute or traumatic finding. Atrophy and chronic small vessel ischemic change of the white matter. 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 Electronically Signed   By: Paulina FusiMark  Shogry M.D.   On: 01/14/2017 15:18

## 2017-01-14 NOTE — ED Provider Notes (Signed)
Care signed over from Dr. Kinner pending results oCyril Loosenf head CT and chemistry. Fortunately the CT scan is negative and the chemistry is negative aside from stable chronic kidney disease. The patient is behaving normally and is stable for discharge with his daughter.   Merrily Brittleifenbark, Serah Nicoletti, MD 01/14/17 [id number]

## 2017-01-14 NOTE — ED Triage Notes (Addendum)
Patient arrived from [facility] for unwitnessed fall in bathroom. Abrasion noted to back of head. EMS reports patient c/o chest pain prior to arrival to ER. Patient has no complaints at this time. BAseline ambulatory with no cane or walker. Does not take blood thinners

## 2017-03-23 ENCOUNTER — Emergency Department: Payer: Medicare Other

## 2017-03-23 ENCOUNTER — Encounter: Payer: Self-pay | Admitting: Emergency Medicine

## 2017-03-23 ENCOUNTER — Emergency Department
Admission: EM | Admit: 2017-03-23 | Discharge: 2017-03-24 | Disposition: A | Discharge status: Home / Self Care | Payer: Medicare Other | Attending: Emergency Medicine | Admitting: Emergency Medicine

## 2017-03-23 DIAGNOSIS — Z87891 Personal history of nicotine dependence: Secondary | ICD-10-CM | POA: Insufficient documentation

## 2017-03-23 DIAGNOSIS — S0990XA Unspecified injury of head, initial encounter: Secondary | ICD-10-CM | POA: Diagnosis not present

## 2017-03-23 DIAGNOSIS — Z79899 Other long term (current) drug therapy: Secondary | ICD-10-CM | POA: Insufficient documentation

## 2017-03-23 DIAGNOSIS — Y92129 Unspecified place in nursing home as the place of occurrence of the external cause: Secondary | ICD-10-CM | POA: Diagnosis not present

## 2017-03-23 DIAGNOSIS — Z7982 Long term (current) use of aspirin: Secondary | ICD-10-CM | POA: Diagnosis not present

## 2017-03-23 DIAGNOSIS — Y999 Unspecified external cause status: Secondary | ICD-10-CM | POA: Diagnosis not present

## 2017-03-23 DIAGNOSIS — W19XXXA Unspecified fall, initial encounter: Secondary | ICD-10-CM | POA: Insufficient documentation

## 2017-03-23 DIAGNOSIS — Y939 Activity, unspecified: Secondary | ICD-10-CM | POA: Diagnosis not present

## 2017-03-23 LAB — URINALYSIS, COMPLETE (UACMP) WITH MICROSCOPIC
BACTERIA UA: NONE SEEN
BILIRUBIN URINE: NEGATIVE
GLUCOSE, UA: NEGATIVE mg/dL
HGB URINE DIPSTICK: NEGATIVE
KETONES UR: NEGATIVE mg/dL
LEUKOCYTES UA: NEGATIVE
NITRITE: NEGATIVE
Protein, ur: NEGATIVE mg/dL
SPECIFIC GRAVITY, URINE: 1.006 (ref 1.005–1.030)
pH: 6 (ref 5.0–8.0)

## 2017-03-23 LAB — CBC
HEMATOCRIT: 34.3 % — AB (ref 40.0–52.0)
Hemoglobin: 11.6 g/dL — ABNORMAL LOW (ref 13.0–18.0)
MCH: 31.7 pg (ref 26.0–34.0)
MCHC: 33.9 g/dL (ref 32.0–36.0)
MCV: 93.5 fL (ref 80.0–100.0)
Platelets: 170 10*3/uL (ref 150–440)
RBC: 3.66 MIL/uL — AB (ref 4.40–5.90)
RDW: 13.1 % (ref 11.5–14.5)
WBC: 9.6 10*3/uL (ref 3.8–10.6)

## 2017-03-23 LAB — BASIC METABOLIC PANEL
Anion gap: 6 (ref 5–15)
BUN: 28 mg/dL — ABNORMAL HIGH (ref 6–20)
CHLORIDE: 102 mmol/L (ref 101–111)
CO2: 31 mmol/L (ref 22–32)
Calcium: 8.7 mg/dL — ABNORMAL LOW (ref 8.9–10.3)
Creatinine, Ser: 1.46 mg/dL — ABNORMAL HIGH (ref 0.61–1.24)
GFR calc non Af Amer: 41 mL/min — ABNORMAL LOW (ref >60)
GFR, EST AFRICAN AMERICAN: 47 mL/min — AB (ref >60)
Glucose, Bld: 133 mg/dL — ABNORMAL HIGH (ref 65–99)
POTASSIUM: 4.4 mmol/L (ref 3.5–5.1)
SODIUM: 139 mmol/L (ref 135–145)

## 2017-03-23 LAB — TROPONIN I: Troponin I: <0.03 ng/mL (ref <0.03)

## 2017-03-23 NOTE — ED Notes (Signed)
Pt placed in C-collar due to post fall

## 2017-03-23 NOTE — ED Provider Notes (Signed)
[facility] Emergency Department Provider Note ____________________________________________   First MD Initiated Contact with Patient 03/23/17 2046     (approximate)  I have reviewed the triage vital signs and the nursing notes.   HISTORY  Chief Complaint Fall  History of present illness limited by dementia  HPI Xavion Deronde is a 83 y.o. male resents after apparent fall from standing height in his facility.Unclear if mechanical fall or if patient felt dizzy or weak. However per facility when patient found he was alert and there was no evidence of LOC.  Patient was bleeding from his forehead. Currently he reports some headache and neck pain, some shoulder pain, and feels slightly nauseous.  Past Medical History:  Diagnosis Date  . Age-related osteoporosis without current pathological fracture   . Benign prostatic hyperplasia without lower urinary tract symptoms   . Constipation   . Dysphagia, oral phase   . Gastro-esophageal reflux disease without esophagitis   . Hyperlipidemia   . Insomnia, unspecified   . Major depressive disorder, single episode, unspecified   . Muscle weakness (generalized)   . Pain, unspecified   . Renal disorder   . Vitamin D deficiency, unspecified     There are no active problems to display for this patient.   History reviewed. No pertinent surgical history.  Prior to Admission medications   Medication Sig Start Date End Date Taking? Authorizing Provider  acetaminophen (TYLENOL) 325 MG tablet Take 650 mg by mouth every 8 (eight) hours as needed for mild pain, moderate pain or fever.     [provider]  acetaminophen (TYLENOL) 500 MG tablet Take 1,000 mg by mouth 2 (two) times daily.    [provider]  carvedilol (COREG) 25 MG tablet Take 25 mg by mouth 2 (two) times daily.    [provider]  cholecalciferol (VITAMIN D) 1000 units tablet Take 1,000 Units by mouth daily.    [provider]  cloNIDine (CATAPRES) 0.1 MG tablet Take 0.1 mg by mouth daily as needed. For bp >170.    [provider]  escitalopram (LEXAPRO) 10 MG tablet Take 10 mg by mouth daily.    [provider]  finasteride (PROSCAR) 5 MG tablet Take 5 mg by mouth daily.    [provider]  isosorbide mononitrate (IMDUR) 60 MG 24 hr tablet Take 90 mg by mouth daily.    [provider]  lactulose (CHRONULAC) 10 GM/15ML solution Take 20 g by mouth every other day.     [provider]  mineral oil-hydrophilic petrolatum (AQUAPHOR) ointment Apply 1 application topically at bedtime. Apply to bilateral lower legs and feet    [provider]  mirabegron ER (MYRBETRIQ) 50 MG TB24 tablet Take 1 tablet (50 mg total) by mouth daily. 09/03/16   Crist Fat, MD  polyethylene glycol [facility] / Ethelene Hal) packet Take 17 g by mouth daily.    [provider]  torsemide (DEMADEX) 20 MG tablet Take 60 mg by mouth daily.     [provider]    Allergies Terazosin; Enalapril; Flomax [tamsulosin hcl]; Hydrochlorothiazide; and Metoprolol  History reviewed. No pertinent family history.  Social History Social History  Substance Use Topics  . Smoking status: Former Smoker    Types: Pipe  . Smokeless tobacco: Never Used  . Alcohol use Not on file    Review of Systems Level V caveat: unable to obtain ROS due to dementia   ____________________________________________   PHYSICAL EXAM:  VITAL  SIGNS: ED Triage Vitals  Enc Vitals Group     BP 03/23/17 2039 120/67     Pulse Rate 03/23/17 2039 (!) 50     Resp 03/23/17 2039 16     Temp 03/23/17 2039 97.7 F (36.5 C)     Temp Source 03/23/17 2039 Oral     SpO2 03/23/17 2039 95 %     Weight 03/23/17 2033 225 lb (102.1 kg)     Height --      Head Circumference --      Peak Flow --      Pain Score --      Pain Loc --      Pain Edu? --      Excl. in GC? --     Constitutional: Alert,  baseline mental status. No acute distress. Eyes: Conjunctivae are normal. EOMI.  PERRL.  Head: One cm extremely superficial lac to forehead, not open Nose: No congestion/rhinnorhea. Mouth/Throat: Slightly dry mucous membranes Neck: Normal range of motion. No focal C-spine tenderness, stepoffs or crepitus.  Cardiovascular:  Good peripheral circulation. Respiratory: Normal respiratory effort.  No retractions.  Gastrointestinal: Soft and nontender. No distention.  Genitourinary: No CVA tenderness. Musculoskeletal: No lower extremity edema.  Extremities warm and well perfused. No midline thoracic or lumbosacral spine tenderness. Full range of motion all joints and no bony tenderness Neurologic:  Normal speech and language. No gross focal neurologic deficits are appreciated. Motor intact in all extremities. Skin:  Skin is warm and dry. No rash noted. Psychiatric: Mood and affect are normal. Speech and behavior are normal.  ____________________________________________   LABS (all labs ordered are listed, but only abnormal results are displayed)  Labs Reviewed  BASIC METABOLIC PANEL - Abnormal; Notable for the following:       Result Value   Glucose, Bld 133 (*)    BUN 28 (*)    Creatinine, Ser 1.46 (*)    Calcium 8.7 (*)    GFR calc non Af Amer 41 (*)    GFR calc Af Amer 47 (*)    All other components within normal limits  CBC - Abnormal; Notable for the following:    RBC 3.66 (*)    Hemoglobin 11.6 (*)    HCT 34.3 (*)    All other components within normal limits  URINALYSIS, COMPLETE (UACMP) WITH MICROSCOPIC - Abnormal; Notable for the following:    Color, Urine STRAW (*)    APPearance CLEAR (*)    Squamous Epithelial / LPF 0-5 (*)    All other components within normal limits  TROPONIN I   ____________________________________________  EKG  ED ECG REPORT I, Dionne Bucy, the attending physician, personally viewed and interpreted this ECG.  Date: 03/23/2017 EKG Time:  2043 Rate: 51 Rhythm: sinus bradycardia, baseline for patient QRS Axis: normal Intervals: prolonged PR interval ST/T Wave abnormalities: normal Narrative Interpretation: no acute findings.  ____________________________________________  RADIOLOGY    ____________________________________________   PROCEDURES  Procedure(s) performed: No    Critical Care performed: No ____________________________________________   INITIAL IMPRESSION / ASSESSMENT AND PLAN / ED COURSE  Pertinent labs & imaging results that were available during my care of the patient were reviewed by me and considered in my medical decision making (see chart for details).  88-year-old male history of dementia presents from facility after unwitnessed fall with no apparent LOC - was alert when found. Her family patient is at his baseline mental status. Unclear if purely mechanical fall however no specific e/o syncope or  near syncope. In ED the vital signs are normal except for sinus bradycardia which is baseline for patient. Exam as described.  Low suspicion for intracranial injury or for any fractures.  Based on patient's age and inability give full history will obtain CT head and C-spine, as well as basic labs and UA given unclear etiology of the fall.  EKG is unremarkable and there is no e/o cardiac cause.  If negative ED workup and patient remains at baseline possible discharge back to facility.  Patient's forehead injury is very superficial and already healing; does not require repair.       ----------------------------------------- 12:03 AM on 03/24/2017 -----------------------------------------  Patient imaging is negative, UA is negative and other labs are unremarkable.  Patient has known impaired renal function.  No indication for prolonged monitoring or for inpatient admission; patient is stable for discharge back to his facility.   ____________________________________________   FINAL CLINICAL  IMPRESSION(S) / ED DIAGNOSES  Final diagnoses:  Injury of head, initial encounter      NEW MEDICATIONS STARTED DURING THIS VISIT:  New Prescriptions   No medications on file     Note:  This document was prepared using Dragon voice recognition software and may include unintentional dictation errors.    Dionne Bucy, MD 03/24/17 0005

## 2017-03-23 NOTE — ED Triage Notes (Signed)
Pt arrived to ED per EMS from [facility], post fall. Per EMS and facility, pt was found in bathroom post unwitnessed fall. Pt baseline is altered/dementia. Pt c/o pain to the left shoulder and neck. Pt has dried blood and bruising to the forehead and around the right eye. Pt A&O to self only.

## 2017-03-24 NOTE — Discharge Instructions (Signed)
Workup in the ER was negative. Patient should be followed by his primary doctor.  Return to ER for change in mental status or any other new or worsening symptoms that are concerning.    **CT revealed a possible thyroid nodule.  Patient should have thyroid testing and/or an ultrasound of the thyroid as an outpatient which can be ordered by his regular doctor.**

## 2017-03-24 NOTE — ED Notes (Signed)
Pt unable to sign for discharge. Report called and given to [facility] given to Louise.

## 2017-03-24 NOTE — ED Notes (Signed)
Pts family updated on pts discharge and diagnosis. [facility] called for report.

## 2017-03-25 ENCOUNTER — Ambulatory Visit: Payer: Self-pay

## 2017-04-02 ENCOUNTER — Other Ambulatory Visit: Payer: Self-pay | Admitting: Neurology

## 2017-04-02 DIAGNOSIS — G309 Alzheimer's disease, unspecified: Principal | ICD-10-CM

## 2017-04-02 DIAGNOSIS — F0280 Dementia in other diseases classified elsewhere without behavioral disturbance: Secondary | ICD-10-CM

## 2017-04-09 ENCOUNTER — Ambulatory Visit
Admission: RE | Admit: 2017-04-09 | Discharge: 2017-04-09 | Disposition: A | Discharge status: Home / Self Care | Payer: Medicare Other | Source: Ambulatory Visit | Attending: Neurology | Admitting: Neurology

## 2017-04-09 DIAGNOSIS — F0280 Dementia in other diseases classified elsewhere without behavioral disturbance: Secondary | ICD-10-CM

## 2017-04-09 DIAGNOSIS — G309 Alzheimer's disease, unspecified: Principal | ICD-10-CM

## 2017-04-15 ENCOUNTER — Encounter: Payer: Self-pay | Admitting: Urology

## 2017-04-15 ENCOUNTER — Ambulatory Visit (INDEPENDENT_AMBULATORY_CARE_PROVIDER_SITE_OTHER): Payer: Medicare Other | Admitting: Urology

## 2017-04-15 VITALS — BP 123/68 | HR 65 | Ht 66.0 in | Wt 214.6 lb

## 2017-04-15 DIAGNOSIS — N401 Enlarged prostate with lower urinary tract symptoms: Secondary | ICD-10-CM | POA: Diagnosis not present

## 2017-04-15 DIAGNOSIS — N138 Other obstructive and reflux uropathy: Secondary | ICD-10-CM

## 2017-04-15 DIAGNOSIS — N3941 Urge incontinence: Secondary | ICD-10-CM

## 2017-04-15 DIAGNOSIS — R350 Frequency of micturition: Secondary | ICD-10-CM

## 2017-04-15 LAB — BLADDER SCAN AMB NON-IMAGING: SCAN RESULT: 67

## 2017-04-15 MED ORDER — SOLIFENACIN SUCCINATE 10 MG PO TABS: 10.0000 mg | ORAL_TABLET | Freq: Every day | ORAL | 11 refills | Status: DC

## 2017-04-15 MED ORDER — SOLIFENACIN SUCCINATE 10 MG PO TABS: 10.0000 mg | ORAL_TABLET | Freq: Every day | ORAL | 11 refills | Status: AC

## 2017-04-15 NOTE — Progress Notes (Signed)
04/15/2017 11:53 AM   Jose Allen 01/01/1927 [id number]  Referring provider: Keane Police, MD [phone] [name], [place] [address]  Chief Complaint  Patient presents with  . Urinary Incontinence    HPI:  F/u - urge incontinence - seen Jan 2018 - overactive bladder and urinary incontinence. The patient states that he has a strong stream, but frequency. He is unable to resist the urges. He uses about 2 or 3 depends per day. He also has associated fecal incontinence. He has no history of urinary tract infections. He does have a history of BPH, he had a previous urologist and was taking Terazosin and finasteride. Terazosin discontinued, he continues on finasteride. His PVR was 42 ml. He tried timed voiding and Myrbetriq 50 mg. NG risk includes dementia.   He continues Avodart and Myrbetriq. He continues to have urgency. No difference on Myrbetriq. PVR 67 ml.    PMH: Past Medical History:  Diagnosis Date  . Age-related osteoporosis without current pathological fracture   . Benign prostatic hyperplasia without lower urinary tract symptoms   . Constipation   . Dysphagia, oral phase   . Gastro-esophageal reflux disease without esophagitis   . Hyperlipidemia   . Insomnia, unspecified   . Major depressive disorder, single episode, unspecified   . Muscle weakness (generalized)   . Pain, unspecified   . Renal disorder   . Vitamin D deficiency, unspecified     Surgical History: No past surgical history on file.  Home Medications:  Allergies as of 04/15/2017      Reactions   Terazosin    Other reaction(s): Other (See Comments) NEAR CARDIAC ARREST   Enalapril    Flomax [tamsulosin Hcl]    Hydrochlorothiazide    Metoprolol       Medication List       Accurate as of 04/15/17 11:53 AM. Always use your most recent med list.          acetaminophen 325 MG tablet Commonly known as:  TYLENOL Take 650 mg by mouth every 8 (eight) hours as needed for mild  pain, moderate pain or fever.   acetaminophen 500 MG tablet Commonly known as:  TYLENOL Take 1,000 mg by mouth 2 (two) times daily.   carvedilol 25 MG tablet Commonly known as:  COREG Take 25 mg by mouth 2 (two) times daily.   cholecalciferol 1000 units tablet Commonly known as:  VITAMIN D Take 1,000 Units by mouth daily.   cloNIDine 0.1 MG tablet Commonly known as:  CATAPRES Take 0.1 mg by mouth daily as needed. For bp >170.   escitalopram 10 MG tablet Commonly known as:  LEXAPRO Take 10 mg by mouth daily.   finasteride 5 MG tablet Commonly known as:  PROSCAR Take 5 mg by mouth daily.   isosorbide mononitrate 60 MG 24 hr tablet Commonly known as:  IMDUR Take 90 mg by mouth daily.   lactulose 10 GM/15ML solution Commonly known as:  CHRONULAC Take 20 g by mouth every other day.   mineral oil-hydrophilic petrolatum ointment Apply 1 application topically at bedtime. Apply to bilateral lower legs and feet   mirabegron ER 50 MG Tb24 tablet Commonly known as:  MYRBETRIQ Take 1 tablet (50 mg total) by mouth daily.   polyethylene glycol packet Commonly known as:  MIRALAX / GLYCOLAX Take 17 g by mouth daily.   torsemide 20 MG tablet Commonly known as:  DEMADEX Take 60 mg by mouth daily.       Allergies:  Allergies  Allergen Reactions  . Terazosin     Other reaction(s): Other (See Comments) NEAR CARDIAC ARREST  . Enalapril   . Flomax [Tamsulosin Hcl]   . Hydrochlorothiazide   . Metoprolol     Family History: No family history on file.  Social History:  reports that he has quit smoking. His smoking use included Pipe. He has never used smokeless tobacco. His alcohol and drug histories are not on file.  ROS:                                        Physical Exam: There were no vitals taken for this visit.  Constitutional:  Alert and oriented, No acute distress. HEENT: [place] AT, moist mucus membranes.  Trachea midline, no  masses. Cardiovascular: No clubbing, cyanosis, or edema. Respiratory: Normal respiratory effort, no increased work of breathing. GI: Abdomen is soft, nontender, nondistended, no abdominal masses GU: No CVA tenderness. Skin: No rashes, bruises or suspicious lesions. Neurologic: Grossly intact, no focal deficits, moving all 4 extremities. Psychiatric: Normal mood and affect.  Laboratory Data: Lab Results  Component Value Date   WBC 9.6 03/23/2017   HGB 11.6 (L) 03/23/2017   HCT 34.3 (L) 03/23/2017   MCV 93.5 03/23/2017   PLT 170 03/23/2017    Lab Results  Component Value Date   CREATININE 1.46 (H) 03/23/2017    No results found for: PSA  No results found for: TESTOSTERONE  No results found for: HGBA1C  Urinalysis    Component Value Date/Time   COLORURINE STRAW (A) 03/23/2017 2304   APPEARANCEUR CLEAR (A) 03/23/2017 2304   APPEARANCEUR Hazy (A) 09/03/2016 1434   LABSPEC 1.006 03/23/2017 2304   LABSPEC 1.003 11/05/2014 0812   PHURINE 6.0 03/23/2017 2304   GLUCOSEU NEGATIVE 03/23/2017 2304   GLUCOSEU Negative 11/05/2014 0812   HGBUR NEGATIVE 03/23/2017 2304   BILIRUBINUR NEGATIVE 03/23/2017 2304   BILIRUBINUR Negative 09/03/2016 1434   BILIRUBINUR Negative 11/05/2014 0812   KETONESUR NEGATIVE 03/23/2017 2304   PROTEINUR NEGATIVE 03/23/2017 2304   NITRITE NEGATIVE 03/23/2017 2304   LEUKOCYTESUR NEGATIVE 03/23/2017 2304   LEUKOCYTESUR Negative 09/03/2016 1434   LEUKOCYTESUR Negative 11/05/2014 0812    Pertinent Imaging:  Assessment & Plan:    BPH with luts - frequency - urge incontinence - continues finasteride. Stop Myrbetriq, start Vesicare 10 mg po daily (discussed with pt and daughter the nature, r/b of anticholinergics). If he fails consider PTNS. F/u 6-8 weeks.   There are no diagnoses linked to this encounter.  No Follow-up on file.  Jerilee FieldESKRIDGE, Shelagh Rayman, MD  [facility] [address], [address] [place], [place] [id number] [phone]  [phone]

## 2017-05-26 ENCOUNTER — Ambulatory Visit (INDEPENDENT_AMBULATORY_CARE_PROVIDER_SITE_OTHER): Payer: Medicare Other | Admitting: Urology

## 2017-05-26 ENCOUNTER — Encounter: Payer: Self-pay | Admitting: Urology

## 2017-05-26 VITALS — BP 143/77 | HR 69 | Ht 66.0 in | Wt 218.9 lb

## 2017-05-26 DIAGNOSIS — N3941 Urge incontinence: Secondary | ICD-10-CM | POA: Diagnosis not present

## 2017-05-26 LAB — BLADDER SCAN AMB NON-IMAGING: Scan Result: 114

## 2017-05-26 NOTE — Progress Notes (Signed)
05/26/2017 11:18 AM   Jose Allen Jan 01, 1926 [id number]  Referring provider: Keane Police, MD [phone] [name], [place] [address]  Chief Complaint  Patient presents with  . Urinary Frequency    HPI: F/u - urge incontinence - seen Jan 2018 - overactive bladder and urinary incontinence. The patient states that he has a strong stream, but frequency. He is unable to resist the urges. He uses about 2 or 3 depends per day. He also has associated fecal incontinence. He has no history of urinary tract infections. He does have a history of BPH, he had a previous urologist and was taking Terazosinand finasteride. Terazosin discontinued, he continues on finasteride.His PVR was 42 ml. He tried timed voiding and Myrbetriq 50 mg. NG risk includes dementia.   He continued  Avodart. He continues to have urgency -- no difference on Myrbetriq. PVR 67 ml. He stopped Myrbetriq and started Vesicare 10 mg Sep 2018.   He returns in continued management of above. Doing well on Vesicare. No bothersome side effects. PVR 114 ml, but did not void prior to scan. Urgency and incontinence episodes improved. Even patient's confusion and behavior has changed for the better. He's less agitated.   Daughter complained of R>L LE swelling and I instructed to see PCP about those findings to r/o DVT.    PMH: Past Medical History:  Diagnosis Date  . Age-related osteoporosis without current pathological fracture   . Benign prostatic hyperplasia without lower urinary tract symptoms   . Constipation   . Dysphagia, oral phase   . Gastro-esophageal reflux disease without esophagitis   . Hyperlipidemia   . Insomnia, unspecified   . Major depressive disorder, single episode, unspecified   . Muscle weakness (generalized)   . Pain, unspecified   . Renal disorder   . Vitamin D deficiency, unspecified     Surgical History: No past surgical history on file.  Home Medications:  Allergies as of  05/26/2017      Reactions   Terazosin    Other reaction(s): Other (See Comments) NEAR CARDIAC ARREST   Enalapril    Flomax [tamsulosin Hcl]    Hydrochlorothiazide    Metoprolol       Medication List       Accurate as of 05/26/17 11:18 AM. Always use your most recent med list.          acetaminophen 325 MG tablet Commonly known as:  TYLENOL Take 650 mg by mouth every 8 (eight) hours as needed for mild pain, moderate pain or fever.   acetaminophen 500 MG tablet Commonly known as:  TYLENOL Take 1,000 mg by mouth 2 (two) times daily.   carvedilol 25 MG tablet Commonly known as:  COREG Take 25 mg by mouth 2 (two) times daily.   cholecalciferol 1000 units tablet Commonly known as:  VITAMIN D Take 1,000 Units by mouth daily.   cloNIDine 0.1 MG tablet Commonly known as:  CATAPRES Take 0.1 mg by mouth daily as needed. For bp >170.   escitalopram 10 MG tablet Commonly known as:  LEXAPRO Take 10 mg by mouth daily.   finasteride 5 MG tablet Commonly known as:  PROSCAR Take 5 mg by mouth daily.   isosorbide mononitrate 60 MG 24 hr tablet Commonly known as:  IMDUR Take 90 mg by mouth daily.   lactulose 10 GM/15ML solution Commonly known as:  CHRONULAC Take 20 g by mouth every other day.   mineral oil-hydrophilic petrolatum ointment Apply 1 application topically at bedtime.  Apply to bilateral lower legs and feet   oxybutynin 5 MG 24 hr tablet Commonly known as:  DITROPAN-XL Take 5 mg by mouth at bedtime.   polyethylene glycol packet Commonly known as:  MIRALAX / GLYCOLAX Take 17 g by mouth daily.   solifenacin 10 MG tablet Commonly known as:  VESICARE Take 1 tablet (10 mg total) by mouth daily.   torsemide 20 MG tablet Commonly known as:  DEMADEX Take 60 mg by mouth daily.       Allergies:  Allergies  Allergen Reactions  . Terazosin     Other reaction(s): Other (See Comments) NEAR CARDIAC ARREST  . Enalapril   . Flomax [Tamsulosin Hcl]   .  Hydrochlorothiazide   . Metoprolol     Family History: No family history on file.  Social History:  reports that he has quit smoking. His smoking use included Pipe. He has never used smokeless tobacco. His alcohol and drug histories are not on file.  ROS: UROLOGY Frequent Urination?: Yes Hard to postpone urination?: No Burning/pain with urination?: No Get up at night to urinate?: No Leakage of urine?: Yes Urine stream starts and stops?: No Trouble starting stream?: No Do you have to strain to urinate?: No Blood in urine?: No Urinary tract infection?: No Sexually transmitted disease?: No Injury to kidneys or bladder?: No Painful intercourse?: No Weak stream?: No Erection problems?: No Penile pain?: No  Gastrointestinal Nausea?: No Vomiting?: No Indigestion/heartburn?: No Diarrhea?: No Constipation?: No  Constitutional Fever: No Night sweats?: No Weight loss?: No Fatigue?: No  Skin Skin rash/lesions?: No Itching?: No  Eyes Blurred vision?: No Double vision?: No  Ears/Nose/Throat Sore throat?: No Sinus problems?: No  Hematologic/Lymphatic Swollen glands?: No Easy bruising?: No  Cardiovascular Leg swelling?: Yes Chest pain?: No  Respiratory Cough?: No Shortness of breath?: No  Endocrine Excessive thirst?: No  Musculoskeletal Back pain?: No Joint pain?: No  Neurological Headaches?: No Dizziness?: No  Psychologic Depression?: No Anxiety?: No  Physical Exam: BP (!) 143/77 (BP Location: Right Arm, Patient Position: Sitting, Cuff Size: Normal)   Pulse 69   Ht 5\' 6"  (1.676 m)   Wt 99.3 kg (218 lb 14.4 oz)   BMI 35.33 kg/m   Constitutional:  Alert and oriented, No acute distress. HEENT: [place] AT, moist mucus membranes.  Trachea midline, no masses. Cardiovascular: No clubbing, cyanosis, or edema. Respiratory: Normal respiratory effort, no increased work of breathing. GI: Abdomen is soft, nontender, nondistended, no abdominal masses GU: No  CVA tenderness.  Skin: No rashes, bruises or suspicious lesions. Neurologic: Grossly intact, no focal deficits, moving all 4 extremities. Psychiatric: Normal mood and affect.  Laboratory Data: Lab Results  Component Value Date   WBC 9.6 03/23/2017   HGB 11.6 (L) 03/23/2017   HCT 34.3 (L) 03/23/2017   MCV 93.5 03/23/2017   PLT 170 03/23/2017    Lab Results  Component Value Date   CREATININE 1.46 (H) 03/23/2017    No results found for: PSA1  No results found for: TESTOSTERONE  No results found for: HGBA1C  Urinalysis Lab Results  Component Value Date   SPECGRAV 1.010 09/03/2016   PHUR 6.5 09/03/2016   COLORU Yellow 09/03/2016   APPEARANCEUR CLEAR (A) 03/23/2017   LEUKOCYTESUR NEGATIVE 03/23/2017   PROTEINUR NEGATIVE 03/23/2017   GLUCOSEU NEGATIVE 03/23/2017   KETONESU Negative 09/03/2016   RBCU 0-5 03/23/2017   BILIRUBINUR NEGATIVE 03/23/2017   UUROB 0.2 09/03/2016   NITRITE NEGATIVE 03/23/2017    Lab Results  Component Value  Date   LABMICR See below: 09/03/2016   WBCUA None seen 09/03/2016   RBCUA 0-2 09/03/2016   LABEPIT None seen 09/03/2016   BACTERIA NONE SEEN 03/23/2017   No results found for this or any previous visit. No results found for this or any previous visit. No results found for this or any previous visit. No results found for this or any previous visit. No results found for this or any previous visit. No results found for this or any previous visit. No results found for this or any previous visit. No results found for this or any previous visit.  Assessment & Plan:    1. Urge incontinence - good improvement. PVR reasonable. See in 1 year or sooner if issues.   - Bladder Scan (Post Void Residual) in office   No Follow-up on file.  Jerilee Field, MD  [facility] [address] [place], [place] [address] [phone]

## 2017-12-15 ENCOUNTER — Emergency Department: Payer: Medicare Other

## 2017-12-15 ENCOUNTER — Other Ambulatory Visit: Payer: Self-pay

## 2017-12-15 ENCOUNTER — Encounter: Payer: Self-pay | Admitting: Emergency Medicine

## 2017-12-15 ENCOUNTER — Inpatient Hospital Stay
Admission: EM | Admit: 2017-12-15 | Discharge: 2017-12-18 | DRG: 640 | Disposition: A | Discharge status: Skilled Nursing Facility | Payer: Medicare Other | Attending: Internal Medicine | Admitting: Internal Medicine

## 2017-12-15 DIAGNOSIS — Z9181 History of falling: Secondary | ICD-10-CM

## 2017-12-15 DIAGNOSIS — G9341 Metabolic encephalopathy: Secondary | ICD-10-CM | POA: Diagnosis present

## 2017-12-15 DIAGNOSIS — M810 Age-related osteoporosis without current pathological fracture: Secondary | ICD-10-CM | POA: Diagnosis present

## 2017-12-15 DIAGNOSIS — N183 Chronic kidney disease, stage 3 (moderate): Secondary | ICD-10-CM | POA: Diagnosis present

## 2017-12-15 DIAGNOSIS — I129 Hypertensive chronic kidney disease with stage 1 through stage 4 chronic kidney disease, or unspecified chronic kidney disease: Secondary | ICD-10-CM | POA: Diagnosis present

## 2017-12-15 DIAGNOSIS — E870 Hyperosmolality and hypernatremia: Principal | ICD-10-CM | POA: Diagnosis present

## 2017-12-15 DIAGNOSIS — E876 Hypokalemia: Secondary | ICD-10-CM | POA: Diagnosis not present

## 2017-12-15 DIAGNOSIS — W19XXXA Unspecified fall, initial encounter: Secondary | ICD-10-CM | POA: Diagnosis present

## 2017-12-15 DIAGNOSIS — S0181XA Laceration without foreign body of other part of head, initial encounter: Secondary | ICD-10-CM | POA: Diagnosis present

## 2017-12-15 DIAGNOSIS — F0281 Dementia in other diseases classified elsewhere with behavioral disturbance: Secondary | ICD-10-CM | POA: Diagnosis present

## 2017-12-15 DIAGNOSIS — N390 Urinary tract infection, site not specified: Secondary | ICD-10-CM | POA: Diagnosis present

## 2017-12-15 DIAGNOSIS — F329 Major depressive disorder, single episode, unspecified: Secondary | ICD-10-CM | POA: Diagnosis present

## 2017-12-15 DIAGNOSIS — F0151 Vascular dementia with behavioral disturbance: Secondary | ICD-10-CM | POA: Diagnosis present

## 2017-12-15 DIAGNOSIS — I2510 Atherosclerotic heart disease of native coronary artery without angina pectoris: Secondary | ICD-10-CM | POA: Diagnosis present

## 2017-12-15 DIAGNOSIS — Z87891 Personal history of nicotine dependence: Secondary | ICD-10-CM | POA: Diagnosis not present

## 2017-12-15 DIAGNOSIS — E871 Hypo-osmolality and hyponatremia: Secondary | ICD-10-CM | POA: Diagnosis present

## 2017-12-15 DIAGNOSIS — G309 Alzheimer's disease, unspecified: Secondary | ICD-10-CM | POA: Diagnosis present

## 2017-12-15 DIAGNOSIS — G9340 Encephalopathy, unspecified: Secondary | ICD-10-CM

## 2017-12-15 DIAGNOSIS — N179 Acute kidney failure, unspecified: Secondary | ICD-10-CM | POA: Diagnosis present

## 2017-12-15 DIAGNOSIS — Z79899 Other long term (current) drug therapy: Secondary | ICD-10-CM

## 2017-12-15 DIAGNOSIS — K219 Gastro-esophageal reflux disease without esophagitis: Secondary | ICD-10-CM | POA: Diagnosis present

## 2017-12-15 DIAGNOSIS — N400 Enlarged prostate without lower urinary tract symptoms: Secondary | ICD-10-CM | POA: Diagnosis present

## 2017-12-15 DIAGNOSIS — E785 Hyperlipidemia, unspecified: Secondary | ICD-10-CM | POA: Diagnosis present

## 2017-12-15 DIAGNOSIS — E860 Dehydration: Secondary | ICD-10-CM | POA: Diagnosis present

## 2017-12-15 DIAGNOSIS — I959 Hypotension, unspecified: Secondary | ICD-10-CM | POA: Diagnosis present

## 2017-12-15 DIAGNOSIS — S0990XA Unspecified injury of head, initial encounter: Secondary | ICD-10-CM

## 2017-12-15 DIAGNOSIS — Z7982 Long term (current) use of aspirin: Secondary | ICD-10-CM | POA: Diagnosis not present

## 2017-12-15 DIAGNOSIS — Z23 Encounter for immunization: Secondary | ICD-10-CM | POA: Diagnosis present

## 2017-12-15 DIAGNOSIS — N189 Chronic kidney disease, unspecified: Secondary | ICD-10-CM

## 2017-12-15 LAB — URINALYSIS, COMPLETE (UACMP) WITH MICROSCOPIC
BILIRUBIN URINE: NEGATIVE
Bacteria, UA: NONE SEEN
Glucose, UA: NEGATIVE mg/dL
HGB URINE DIPSTICK: NEGATIVE
KETONES UR: NEGATIVE mg/dL
LEUKOCYTES UA: NEGATIVE
NITRITE: NEGATIVE
PROTEIN: NEGATIVE mg/dL
Specific Gravity, Urine: 1.013 (ref 1.005–1.030)
pH: 6 (ref 5.0–8.0)

## 2017-12-15 LAB — BASIC METABOLIC PANEL
ANION GAP: 10 (ref 5–15)
BUN: 87 mg/dL — ABNORMAL HIGH (ref 6–20)
CHLORIDE: 115 mmol/L — AB (ref 101–111)
CO2: 35 mmol/L — AB (ref 22–32)
Calcium: 8.7 mg/dL — ABNORMAL LOW (ref 8.9–10.3)
Creatinine, Ser: 3.12 mg/dL — ABNORMAL HIGH (ref 0.61–1.24)
GFR calc non Af Amer: 16 mL/min — ABNORMAL LOW (ref >60)
GFR, EST AFRICAN AMERICAN: 19 mL/min — AB (ref >60)
GLUCOSE: 126 mg/dL — AB (ref 65–99)
Potassium: 4.5 mmol/L (ref 3.5–5.1)
Sodium: 160 mmol/L — ABNORMAL HIGH (ref 135–145)

## 2017-12-15 LAB — SODIUM
SODIUM: 156 mmol/L — AB (ref 135–145)
Sodium: 157 mmol/L — ABNORMAL HIGH (ref 135–145)

## 2017-12-15 LAB — CBC
HCT: 39 % — ABNORMAL LOW (ref 40.0–52.0)
HEMOGLOBIN: 12.9 g/dL — AB (ref 13.0–18.0)
MCH: 31.9 pg (ref 26.0–34.0)
MCHC: 33.1 g/dL (ref 32.0–36.0)
MCV: 96.5 fL (ref 80.0–100.0)
Platelets: 183 10*3/uL (ref 150–440)
RBC: 4.04 MIL/uL — ABNORMAL LOW (ref 4.40–5.90)
RDW: 13.7 % (ref 11.5–14.5)
WBC: 9.6 10*3/uL (ref 3.8–10.6)

## 2017-12-15 LAB — MRSA PCR SCREENING: MRSA BY PCR: NEGATIVE

## 2017-12-15 MED ORDER — POLYETHYLENE GLYCOL 3350 17 G PO PACK
17.0000 g | PACK | Freq: Every day | ORAL | Status: DC
  Administered 2017-12-15 – 2017-12-18 (×3): 17 g via ORAL
  Filled 2017-12-15 (×3): qty 1

## 2017-12-15 MED ORDER — TRAZODONE HCL 50 MG PO TABS
25.0000 mg | ORAL_TABLET | Freq: Every evening | ORAL | Status: DC | PRN
Start: 2017-12-15 — End: 2017-12-15

## 2017-12-15 MED ORDER — BISACODYL 5 MG PO TBEC: 5.0000 mg | DELAYED_RELEASE_TABLET | Freq: Every day | ORAL | Status: DC | PRN

## 2017-12-15 MED ORDER — ASPIRIN EC 81 MG PO TBEC
81.0000 mg | DELAYED_RELEASE_TABLET | Freq: Every day | ORAL | Status: DC
  Administered 2017-12-15 – 2017-12-18 (×3): 81 mg via ORAL
  Filled 2017-12-15 (×3): qty 1

## 2017-12-15 MED ORDER — HEPARIN SODIUM (PORCINE) 5000 UNIT/ML IJ SOLN
5000.0000 [IU] | Freq: Three times a day (TID) | INTRAMUSCULAR | Status: DC
  Administered 2017-12-15 – 2017-12-18 (×8): 5000 [IU] via SUBCUTANEOUS
  Filled 2017-12-15 (×8): qty 1

## 2017-12-15 MED ORDER — SODIUM CHLORIDE 0.9 % IV SOLN: INTRAVENOUS | Status: DC

## 2017-12-15 MED ORDER — ISOSORBIDE MONONITRATE ER 30 MG PO TB24
90.0000 mg | ORAL_TABLET | Freq: Every day | ORAL | Status: DC
  Filled 2017-12-15: qty 3

## 2017-12-15 MED ORDER — ACETAMINOPHEN 325 MG PO TABS: 650.0000 mg | ORAL_TABLET | Freq: Four times a day (QID) | ORAL | Status: DC | PRN

## 2017-12-15 MED ORDER — VITAMIN D 1000 UNITS PO TABS
1000.0000 [IU] | ORAL_TABLET | Freq: Every day | ORAL | Status: DC
  Administered 2017-12-15 – 2017-12-18 (×3): 1000 [IU] via ORAL
  Filled 2017-12-15 (×3): qty 1

## 2017-12-15 MED ORDER — AMLODIPINE BESYLATE 5 MG PO TABS
5.0000 mg | ORAL_TABLET | Freq: Every day | ORAL | Status: DC
  Filled 2017-12-15: qty 1

## 2017-12-15 MED ORDER — ORAL CARE MOUTH RINSE
15.0000 mL | Freq: Two times a day (BID) | OROMUCOSAL | Status: DC
  Administered 2017-12-15 – 2017-12-18 (×4): 15 mL via OROMUCOSAL

## 2017-12-15 MED ORDER — ONDANSETRON HCL 4 MG PO TABS: 4.0000 mg | ORAL_TABLET | Freq: Four times a day (QID) | ORAL | Status: DC | PRN

## 2017-12-15 MED ORDER — ACETAMINOPHEN 500 MG PO TABS
1000.0000 mg | ORAL_TABLET | Freq: Two times a day (BID) | ORAL | Status: DC
  Administered 2017-12-15 – 2017-12-18 (×6): 1000 mg via ORAL
  Filled 2017-12-15 (×6): qty 2

## 2017-12-15 MED ORDER — ACETAMINOPHEN 650 MG RE SUPP: 650.0000 mg | Freq: Four times a day (QID) | RECTAL | Status: DC | PRN

## 2017-12-15 MED ORDER — DOCUSATE SODIUM 100 MG PO CAPS
100.0000 mg | ORAL_CAPSULE | Freq: Two times a day (BID) | ORAL | Status: DC
  Administered 2017-12-15 – 2017-12-17 (×4): 100 mg via ORAL
  Filled 2017-12-15 (×5): qty 1

## 2017-12-15 MED ORDER — TETANUS-DIPHTH-ACELL PERTUSSIS 5-2.5-18.5 LF-MCG/0.5 IM SUSP
0.5000 mL | Freq: Once | INTRAMUSCULAR | Status: AC
  Administered 2017-12-15: 0.5 mL via INTRAMUSCULAR
  Filled 2017-12-15: qty 0.5

## 2017-12-15 MED ORDER — TRAZODONE HCL 50 MG PO TABS
25.0000 mg | ORAL_TABLET | Freq: Every day | ORAL | Status: DC
  Administered 2017-12-15 – 2017-12-17 (×3): 25 mg via ORAL
  Filled 2017-12-15 (×3): qty 1

## 2017-12-15 MED ORDER — ACETAMINOPHEN 325 MG PO TABS: 650.0000 mg | ORAL_TABLET | Freq: Three times a day (TID) | ORAL | Status: DC | PRN

## 2017-12-15 MED ORDER — NITROFURANTOIN MACROCRYSTAL 50 MG PO CAPS
100.0000 mg | ORAL_CAPSULE | Freq: Two times a day (BID) | ORAL | Status: DC
  Administered 2017-12-15 (×2): 100 mg via ORAL
  Filled 2017-12-15 (×3): qty 2

## 2017-12-15 MED ORDER — DEXTROSE 5 % IV SOLN
INTRAVENOUS | Status: DC
  Administered 2017-12-15 – 2017-12-18 (×7): via INTRAVENOUS

## 2017-12-15 MED ORDER — ONDANSETRON HCL 4 MG/2ML IJ SOLN: 4.0000 mg | Freq: Four times a day (QID) | INTRAMUSCULAR | Status: DC | PRN

## 2017-12-15 MED ORDER — LACTULOSE 10 GM/15ML PO SOLN
20.0000 g | ORAL | Status: DC
  Administered 2017-12-18: 10:00:00 20 g via ORAL
  Filled 2017-12-15 (×2): qty 30

## 2017-12-15 MED ORDER — HYDROCODONE-ACETAMINOPHEN 5-325 MG PO TABS: 1.0000 | ORAL_TABLET | ORAL | Status: DC | PRN

## 2017-12-15 MED ORDER — SODIUM CHLORIDE 0.45 % IV SOLN
1000.0000 mL | Freq: Once | INTRAVENOUS | Status: AC
Start: 2017-12-15 — End: 2017-12-15
  Administered 2017-12-15: 1000 mL via INTRAVENOUS

## 2017-12-15 MED ORDER — FINASTERIDE 5 MG PO TABS
5.0000 mg | ORAL_TABLET | Freq: Every day | ORAL | Status: DC
  Administered 2017-12-15 – 2017-12-18 (×3): 5 mg via ORAL
  Filled 2017-12-15 (×3): qty 1

## 2017-12-15 MED ORDER — CARVEDILOL 25 MG PO TABS
25.0000 mg | ORAL_TABLET | Freq: Two times a day (BID) | ORAL | Status: DC
  Filled 2017-12-15: qty 1

## 2017-12-15 MED ORDER — ESCITALOPRAM OXALATE 10 MG PO TABS
10.0000 mg | ORAL_TABLET | Freq: Every day | ORAL | Status: DC
  Administered 2017-12-15: 15:00:00 10 mg via ORAL
  Filled 2017-12-15: qty 1

## 2017-12-15 NOTE — ED Provider Notes (Signed)
[facility] Emergency Department Provider Note  ____________________________________________  Time seen: Approximately 7:37 AM  I have reviewed the triage vital signs and the nursing notes.   HISTORY  Chief Complaint Fall  Level 5 caveat:  Portions of the history and physical were unable to be obtained due to dementia   HPI Jose Allen is a 84 y.o. male the history of advanced Alzheimer's dementia, osteoporosis, hyperlipidemia, depression, and kidney disease who presents from his nursing home after a fall.  According to the nursing home patient has not been eating and drinking.  Was found to have a sodium of 149.  Was given 1.8 L of normal saline.  Repeat sodium was 163.  Patient has been more lethargic.  Usually he is able to follow simple commands and usually able to recognize his family.  Patient had a witnessed fall today and sustained a laceration to his forehead.  No syncopal event.  He is not on blood thinners.  Patient right now is holding a blanket over his head and will not answer any questions and tries to fight to the removal of the blanket.  Past Medical History:  Diagnosis Date  . Age-related osteoporosis without current pathological fracture   . Benign prostatic hyperplasia without lower urinary tract symptoms   . Constipation   . Dysphagia, oral phase   . Gastro-esophageal reflux disease without esophagitis   . Hyperlipidemia   . Insomnia, unspecified   . Major depressive disorder, single episode, unspecified   . Muscle weakness (generalized)   . Pain, unspecified   . Renal disorder   . Vitamin D deficiency, unspecified     There are no active problems to display for this patient.   History reviewed. No pertinent surgical history.  Prior to Admission medications   Medication Sig Start Date End Date Taking? Authorizing Provider  acetaminophen (TYLENOL) 325 MG tablet Take 650 mg by mouth every 8 (eight) hours as needed for mild  pain, moderate pain or fever.     [provider]  acetaminophen (TYLENOL) 500 MG tablet Take 1,000 mg by mouth 2 (two) times daily.    [provider]  carvedilol (COREG) 25 MG tablet Take 25 mg by mouth 2 (two) times daily.    [provider]  cholecalciferol (VITAMIN D) 1000 units tablet Take 1,000 Units by mouth daily.    [provider]  cloNIDine (CATAPRES) 0.1 MG tablet Take 0.1 mg by mouth daily as needed. For bp >170.    [provider]  escitalopram (LEXAPRO) 10 MG tablet Take 10 mg by mouth daily.    [provider]  finasteride (PROSCAR) 5 MG tablet Take 5 mg by mouth daily.    [provider]  isosorbide mononitrate (IMDUR) 60 MG 24 hr tablet Take 90 mg by mouth daily.    [provider]  lactulose (CHRONULAC) 10 GM/15ML solution Take 20 g by mouth every other day.     [provider]  mineral oil-hydrophilic petrolatum (AQUAPHOR) ointment Apply 1 application topically at bedtime. Apply to bilateral lower legs and feet    [provider]  oxybutynin (DITROPAN-XL) 5 MG 24 hr tablet Take 5 mg by mouth at bedtime.    [provider]  polyethylene glycol (MIRALAX / GLYCOLAX) packet Take 17 g by mouth daily.    [provider]  solifenacin (VESICARE) 10 MG tablet Take 1 tablet (10 mg total) by mouth daily. 04/15/17   Jerilee Field, MD  torsemide (  DEMADEX) 20 MG tablet Take 60 mg by mouth daily.     [provider]    Allergies Terazosin; Enalapril; Flomax [tamsulosin hcl]; Hydrochlorothiazide; and Metoprolol  No family history on file.  Social History Social History   Tobacco Use  . Smoking status: Former Smoker    Types: Pipe  . Smokeless tobacco: Never Used  Substance Use Topics  . Alcohol use: Not on file  . Drug use: Not on file    Review of Systems Constitutional: Negative for fever. + confusion ENT: Negative for facial injury or neck  injury Cardiovascular: Negative for chest injury. Respiratory: Negative for chest wall injury. Gastrointestinal: Negative for abdominal injury Musculoskeletal: Negative for back injury, negative for arm or leg pain. Skin: + forehead laceration Neurological: + head injury.   ____________________________________________   PHYSICAL EXAM:  Vitals:   12/15/17 0733 12/15/17 0737  BP:  106/63  Pulse:  60  Resp: 16   Temp:  (!) 96.7 F (35.9 C)  SpO2:  97%   Full spinal precautions maintained throughout the trauma exam.  Constitutional: Awake, holding a blanket over his body, fights when we try to remove the blanket, mumbling incoherent, not answering questions or following commands HEENT: Head: Normocephalic, hematoma and laceration on the forehead. Face: No facial bony tenderness. Stable midface Ears: No hemotympanum bilaterally. No Battle sign Eyes: No eye injury. PERRL. No raccoon eyes Nose: Nontender. No epistaxis. No rhinorrhea Mouth/Throat: Mucous membranes are dry. No oropharyngeal blood. No dental injury. Airway patent without stridor.             Neck: no C-collar in place. No midline c-spine tenderness.  Cardiovascular: Normal rate, regular rhythm. Normal and symmetric distal pulses are present in all extremities. Pulmonary/Chest: Chest wall is stable and nontender to palpation/compression. Normal respiratory effort. Breath sounds are normal. No crepitus.  Abdominal: Soft, nontender, non distended. Musculoskeletal: Nontender with normal full range of motion in all extremities. No deformities. No thoracic or lumbar midline spinal tenderness. Pelvis is stable. Skin: Skin is warm, dry and intact. No abrasions or contutions. Neurological: Mumbles, fights with intact sensation on all 4 extremities, face is symmetric  Glascow Coma Score: 4 - Opens eyes on own 5 - Pushes away noxious stimulus 2 - Moans, makes unintelligible sounds GCS:  11   ____________________________________________   LABS (all labs ordered are listed, but only abnormal results are displayed)  Labs Reviewed  CBC - Abnormal; Notable for the following components:      Result Value   RBC 4.04 (*)    Hemoglobin 12.9 (*)    HCT 39.0 (*)    All other components within normal limits  BASIC METABOLIC PANEL - Abnormal; Notable for the following components:   Sodium 160 (*)    Chloride 115 (*)    CO2 35 (*)    Glucose, Bld 126 (*)    BUN 87 (*)    Creatinine, Ser 3.12 (*)    Calcium 8.7 (*)    GFR calc non Af Amer 16 (*)    GFR calc Af Amer 19 (*)    All other components within normal limits  URINALYSIS, COMPLETE (UACMP) WITH MICROSCOPIC   ____________________________________________  EKG  none  ____________________________________________  RADIOLOGY  I have personally reviewed the images performed during this visit and I agree with the Radiologist's read.   Interpretation by Radiologist:  Ct Head Wo Contrast  Result Date: 12/15/2017 CLINICAL DATA:  84 year old male post fall in bathroom. Laceration forehead. Dementia. No  reported loss of consciousness. Initial encounter. EXAM: CT HEAD WITHOUT CONTRAST CT CERVICAL SPINE WITHOUT CONTRAST TECHNIQUE: Multidetector CT imaging of the head and cervical spine was performed following the standard protocol without intravenous contrast. Multiplanar CT image reconstructions of the cervical spine were also generated. COMPARISON:  03/23/2017 CT. FINDINGS: CT HEAD FINDINGS Brain: No intracranial hemorrhage or CT evidence of large acute infarct. Chronic microvascular changes. 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 Moderate global atrophy. No intracranial mass lesion noted on this unenhanced exam. Vascular: Vascular calcifications. Skull: No skull fracture Sinuses/Orbits: Visualized orbits unremarkable. Mild mucosal thickening ethmoid sinus air cells on upper aspect  of maxillary sinuses. Mastoid air cells and middle ear cavities are clear. Other: Small scalp hematoma/laceration right frontal region. CT CERVICAL SPINE FINDINGS Alignment: Mild rotation of head/C1 upon C2 probably related to head positioning. Straightening of the cervical spine. Skull base and vertebrae: No cervical spine fracture noted. Soft tissues and spinal canal: No abnormal prevertebral soft tissue swelling. Disc levels: Cervical spondylotic changes with various degrees spinal stenosis and foraminal narrowing C3-4 through C6-7. Upper chest: Negative Other: Enlarged thyroid gland. IMPRESSION: Small scalp hematoma/laceration right frontal region. No skull fracture or intracranial hemorrhage. Mild rotation of head/C1 upon C2 probably related to head positioning. No cervical spine fracture noted. No abnormal prevertebral soft tissue swelling. Chronic changes otherwise as noted above. Electronically Signed   By: Lacy Duverney M.D.   On: 12/15/2017 08:26   Ct Cervical Spine Wo Contrast  Result Date: 12/15/2017 CLINICAL DATA:  84 year old male post fall in bathroom. Laceration forehead. Dementia. No reported loss of consciousness. Initial encounter. EXAM: CT HEAD WITHOUT CONTRAST CT CERVICAL SPINE WITHOUT CONTRAST TECHNIQUE: Multidetector CT imaging of the head and cervical spine was performed following the standard protocol without intravenous contrast. Multiplanar CT image reconstructions of the cervical spine were also generated. COMPARISON:  03/23/2017 CT. FINDINGS: CT HEAD FINDINGS Brain: No intracranial hemorrhage or CT evidence of large acute infarct. Chronic microvascular changes. 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 Moderate global atrophy. No intracranial mass lesion noted on this unenhanced exam. Vascular: Vascular calcifications. Skull: No skull fracture Sinuses/Orbits: Visualized orbits unremarkable. Mild mucosal thickening ethmoid sinus air cells on  upper aspect of maxillary sinuses. Mastoid air cells and middle ear cavities are clear. Other: Small scalp hematoma/laceration right frontal region. CT CERVICAL SPINE FINDINGS Alignment: Mild rotation of head/C1 upon C2 probably related to head positioning. Straightening of the cervical spine. Skull base and vertebrae: No cervical spine fracture noted. Soft tissues and spinal canal: No abnormal prevertebral soft tissue swelling. Disc levels: Cervical spondylotic changes with various degrees spinal stenosis and foraminal narrowing C3-4 through C6-7. Upper chest: Negative Other: Enlarged thyroid gland. IMPRESSION: Small scalp hematoma/laceration right frontal region. No skull fracture or intracranial hemorrhage. Mild rotation of head/C1 upon C2 probably related to head positioning. No cervical spine fracture noted. No abnormal prevertebral soft tissue swelling. Chronic changes otherwise as noted above. Electronically Signed   By: Lacy Duverney M.D.   On: 12/15/2017 08:26      ____________________________________________   PROCEDURES  Procedure(s) performed:yes .Marland KitchenLaceration Repair Date/Time: 12/15/2017 8:37 AM Performed by: Nita Sickle, MD Authorized by: Nita Sickle, MD   Consent:    Consent obtained:  Verbal   Consent given by:  Patient   Risks discussed:  Infection, pain, retained foreign body, poor cosmetic result and poor wound healing Anesthesia (see MAR for exact dosages):    Anesthesia method:  None Laceration details:    Location:  Face   Face location:  Forehead   Length (cm):  2 Repair type:    Repair type:  Simple Pre-procedure details:    Preparation:  Imaging obtained to evaluate for foreign bodies Exploration:    Hemostasis achieved with:  Direct pressure   Wound exploration: entire depth of wound probed and visualized     Wound extent: underlying fracture     Wound extent: no fascia violation noted and no foreign bodies/material noted     Contaminated: no     Treatment:    Area cleansed with:  Saline   Amount of cleaning:  Standard   Irrigation solution:  Sterile saline   Visualized foreign bodies/material removed: no   Skin repair:    Repair method:  Steri-Strips   Number of Steri-Strips:  6 Approximation:    Approximation:  Close Post-procedure details:    Dressing:  Open (no dressing)   Patient tolerance of procedure:  Tolerated well, no immediate complications   Critical Care performed:  None ____________________________________________   INITIAL IMPRESSION / ASSESSMENT AND PLAN / ED COURSE   84 y.o. male the history of advanced Alzheimer's dementia, osteoporosis, hyperlipidemia, depression, and kidney disease who presents from his nursing home after a fall. Patent being treated at SNF for hypernatremia, more confused than baseline.  Patient has laceration and a hematoma in his forehead but no other obvious injuries on exam. Will check labs to evaluate electrolytes and kidney function. Will get CT head and cspine. Will give tetanus booster  Clinical Course as of Dec 16 834  Mon Dec 15, 2017  4098 Laceration repaired per procedure note above. CTs with no acute findings. Labs consistent with hypernatremia and AKI, will give 1/2NS bolus and admit to Hospitalist   [CV]    Clinical Course User Index [CV] Don Perking [place], MD     As part of my medical decision making, I reviewed the following data within the electronic MEDICAL RECORD NUMBER Nursing notes reviewed and incorporated, Labs reviewed , Old chart reviewed, Radiograph reviewed , Discussed with admitting physician , Notes from prior ED visits and [place] Controlled Substance Database    Pertinent labs & imaging results that were available during my care of the patient were reviewed by me and considered in my medical decision making (see chart for details).    ____________________________________________   FINAL CLINICAL IMPRESSION(S) / ED DIAGNOSES  Final diagnoses:  Acute  kidney injury superimposed on chronic kidney disease (HCC)  Hypernatremia  Fall, initial encounter  Injury of head, initial encounter  Laceration of forehead, initial encounter  Encephalopathy acute      NEW MEDICATIONS STARTED DURING THIS VISIT:  ED Discharge Orders    None       Note:  This document was prepared using Dragon voice recognition software and may include unintentional dictation errors.    Nita Sickle, MD 12/15/17 [phone]

## 2017-12-15 NOTE — H&P (Signed)
[employer] [facility] [place] at [facility]   PATIENT NAME: Jose Allen    MR#:  [id number]  DATE OF BIRTH:  [birth year]  DATE OF ADMISSION:  12/15/2017  PRIMARY CARE PHYSICIAN: Keane Police, MD   REQUESTING/REFERRING PHYSICIAN: Nita Sickle, MD  CHIEF COMPLAINT:   Chief Complaint  Patient presents with  . Fall   HISTORY OF PRESENT ILLNESS:  Jose Allen  is a 84 y.o. male with a known history of advanced Alzheimer's dementia, osteoporosis, hyperlipidemia, depression, and kidney disease who presents from [facility] where he is a long-term resident for last 3 years came after a fall.  According to daughter patient has not been eating/drinking for last 2-3 weeks and has been less responsive. This started after nursing home psych NP placed him on depakote for his agitation. Family took him to Dr Sherryll Burger [facility]) last Thursday and his depakote was stopped. At facility, His labs showed a sodium of 149.  Was given 1.8 L of normal saline.  Repeat sodium was 163.  Patient had been more lethargic.  Usually he is able to follow simple commands and usually able to recognize his family.  Patient had a witnessed fall today and sustained a laceration to his forehead.  No syncopal event.  He is not on blood thinners. Brought to ED and Na is noted to be 160. He's being admitted for further eval and mgmt. PAST MEDICAL HISTORY:   Past Medical History:  Diagnosis Date  . Age-related osteoporosis without current pathological fracture   . Benign prostatic hyperplasia without lower urinary tract symptoms   . Constipation   . Dysphagia, oral phase   . Gastro-esophageal reflux disease without esophagitis   . Hyperlipidemia   . Insomnia, unspecified   . Major depressive disorder, single episode, unspecified   . Muscle weakness (generalized)   . Pain, unspecified   . Renal disorder   . Vitamin D deficiency, unspecified    PAST SURGICAL HISTORY:  History reviewed. No  pertinent surgical history.  SOCIAL HISTORY:   Social History   Tobacco Use  . Smoking status: Former Smoker    Types: Pipe  . Smokeless tobacco: Never Used  Substance Use Topics  . Alcohol use: Not on file    FAMILY HISTORY:  No family history on file. Heart dz + in family Dementia + in family as well DRUG ALLERGIES:   Allergies  Allergen Reactions  . Terazosin     Other reaction(s): Other (See Comments) NEAR CARDIAC ARREST  . Enalapril   . Flomax [Tamsulosin Hcl]   . Hydrochlorothiazide   . Metoprolol     REVIEW OF SYSTEMS:   Review of Systems  Unable to perform ROS: Dementia   MEDICATIONS AT HOME:   Prior to Admission medications   Medication Sig Start Date End Date Taking? Authorizing Provider  acetaminophen (TYLENOL) 325 MG tablet Take 650 mg by mouth every 8 (eight) hours as needed for mild pain, moderate pain or fever.    Yes [provider]  acetaminophen (TYLENOL) 500 MG tablet Take 1,000 mg by mouth 2 (two) times daily.   Yes [provider]  amLODipine (NORVASC) 5 MG tablet Take 5 mg by mouth daily.   Yes [provider]  aspirin EC 81 MG tablet Take 81 mg by mouth daily.   Yes [provider]  carvedilol (COREG) 25 MG tablet Take 25 mg by mouth 2 (two) times daily.   Yes [provider]  cholecalciferol (VITAMIN  D) 1000 units tablet Take 1,000 Units by mouth daily.   Yes [provider]  cloNIDine (CATAPRES) 0.1 MG tablet Take 0.1 mg by mouth daily as needed. For bp >170.   Yes [provider]  escitalopram (LEXAPRO) 10 MG tablet Take 10 mg by mouth daily.   Yes [provider]  finasteride (PROSCAR) 5 MG tablet Take 5 mg by mouth daily.   Yes [provider]  isosorbide mononitrate (IMDUR) 60 MG 24 hr tablet Take 90 mg by mouth daily.   Yes [provider]  lactulose (CHRONULAC) 10 GM/15ML solution Take 20 g by mouth every other day.    Yes [provider]    nitrofurantoin (MACRODANTIN) 100 MG capsule Take 100 mg by mouth 2 (two) times daily.   Yes [provider]  polyethylene glycol (MIRALAX / GLYCOLAX) packet Take 17 g by mouth daily.   Yes [provider]  solifenacin (VESICARE) 10 MG tablet Take 1 tablet (10 mg total) by mouth daily. 04/15/17  Yes Jerilee Field, MD  torsemide (DEMADEX) 20 MG tablet Take 60 mg by mouth daily.    Yes [provider]  traZODone (DESYREL) 50 MG tablet Take 25 mg by mouth at bedtime.   Yes [provider]  mineral oil-hydrophilic petrolatum (AQUAPHOR) ointment Apply 1 application topically at bedtime. Apply to bilateral lower legs and feet    [provider]      VITAL SIGNS:  Blood pressure 106/63, pulse (!) 57, temperature (!) 96.7 F (35.9 C), temperature source Axillary, resp. rate 16, SpO2 100 %.  PHYSICAL EXAMINATION:  Physical Exam  GENERAL:  84 y.o.-year-old patient lying in the bed with no acute distress.  EYES: Pupils equal, round, reactive to light and accommodation. No scleral icterus. Extraocular muscles intact.  HEENT: Head stiches and steri-strip in place, normocephalic. Oropharynx and nasopharynx clear.  NECK:  Supple, no jugular venous distention. No thyroid enlargement, no tenderness.  LUNGS: Normal breath sounds bilaterally, no wheezing, rales,rhonchi or crepitation. No use of accessory muscles of respiration.  CARDIOVASCULAR: S1, S2 normal. No murmurs, rubs, or gallops.  ABDOMEN: Soft, nontender, nondistended. Bowel sounds present. No organomegaly or mass.  EXTREMITIES: No pedal edema, cyanosis, or clubbing.  NEUROLOGIC: Cranial nerves II through XII are intact. Muscle strength 3/5 in all extremities. Sensation intact. Gait not checked. mumbling incoherent, not answering questions or following commands PSYCHIATRIC: The patient is lethargic so unable to assess SKIN: No obvious rash, lesion, or ulcer. Forehead laceration as above LABORATORY  PANEL:   CBC Recent Labs  Lab 12/15/17 0736  WBC 9.6  HGB 12.9*  HCT 39.0*  PLT 183   ------------------------------------------------------------------------------------------------------------------  Chemistries  Recent Labs  Lab 12/15/17 0736  NA 160*  K 4.5  CL 115*  CO2 35*  GLUCOSE 126*  BUN 87*  CREATININE 3.12*  CALCIUM 8.7*   ------------------------------------------------------------------------------------------------------------------  Cardiac Enzymes No results for input(s): TROPONINI in the last 168 hours. ------------------------------------------------------------------------------------------------------------------  RADIOLOGY:  Ct Head Wo Contrast  Result Date: 12/15/2017 CLINICAL DATA:  84 year old male post fall in bathroom. Laceration forehead. Dementia. No reported loss of consciousness. Initial encounter. EXAM: CT HEAD WITHOUT CONTRAST CT CERVICAL SPINE WITHOUT CONTRAST TECHNIQUE: Multidetector CT imaging of the head and cervical spine was performed following the standard protocol without intravenous contrast. Multiplanar CT image reconstructions of the cervical spine were also generated. COMPARISON:  03/23/2017 CT. FINDINGS: CT HEAD FINDINGS Brain: No intracranial hemorrhage or CT evidence of large acute infarct. Chronic microvascular changes. Remote  left parietal lobe infarct. Remote posterior left lenticular nucleus infarct. Moderate global atrophy. No intracranial mass lesion noted on this unenhanced exam. Vascular: Vascular calcifications. Skull: No skull fracture Sinuses/Orbits: Visualized orbits unremarkable. Mild mucosal thickening ethmoid sinus air cells on upper aspect of maxillary sinuses. Mastoid air cells and middle ear cavities are clear. Other: Small scalp hematoma/laceration right frontal region. CT CERVICAL SPINE FINDINGS Alignment: Mild rotation of head/C1 upon C2 probably related to head positioning. Straightening of the cervical spine.  Skull base and vertebrae: No cervical spine fracture noted. Soft tissues and spinal canal: No abnormal prevertebral soft tissue swelling. Disc levels: Cervical spondylotic changes with various degrees spinal stenosis and foraminal narrowing C3-4 through C6-7. Upper chest: Negative Other: Enlarged thyroid gland. IMPRESSION: Small scalp hematoma/laceration right frontal region. No skull fracture or intracranial hemorrhage. Mild rotation of head/C1 upon C2 probably related to head positioning. No cervical spine fracture noted. No abnormal prevertebral soft tissue swelling. Chronic changes otherwise as noted above. Electronically Signed   By: Lacy Duverney M.D.   On: 12/15/2017 08:26   Ct Cervical Spine Wo Contrast  Result Date: 12/15/2017 CLINICAL DATA:  84 year old male post fall in bathroom. Laceration forehead. Dementia. No reported loss of consciousness. Initial encounter. EXAM: CT HEAD WITHOUT CONTRAST CT CERVICAL SPINE WITHOUT CONTRAST TECHNIQUE: Multidetector CT imaging of the head and cervical spine was performed following the standard protocol without intravenous contrast. Multiplanar CT image reconstructions of the cervical spine were also generated. COMPARISON:  03/23/2017 CT. FINDINGS: CT HEAD FINDINGS Brain: No intracranial hemorrhage or CT evidence of large acute infarct. Chronic microvascular changes. 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 Moderate global atrophy. No intracranial mass lesion noted on this unenhanced exam. Vascular: Vascular calcifications. Skull: No skull fracture Sinuses/Orbits: Visualized orbits unremarkable. Mild mucosal thickening ethmoid sinus air cells on upper aspect of maxillary sinuses. Mastoid air cells and middle ear cavities are clear. Other: Small scalp hematoma/laceration right frontal region. CT CERVICAL SPINE FINDINGS Alignment: Mild rotation of head/C1 upon C2 probably related to head positioning. Straightening of the  cervical spine. Skull base and vertebrae: No cervical spine fracture noted. Soft tissues and spinal canal: No abnormal prevertebral soft tissue swelling. Disc levels: Cervical spondylotic changes with various degrees spinal stenosis and foraminal narrowing C3-4 through C6-7. Upper chest: Negative Other: Enlarged thyroid gland. IMPRESSION: Small scalp hematoma/laceration right frontal region. No skull fracture or intracranial hemorrhage. Mild rotation of head/C1 upon C2 probably related to head positioning. No cervical spine fracture noted. No abnormal prevertebral soft tissue swelling. Chronic changes otherwise as noted above. Electronically Signed   By: Lacy Duverney M.D.   On: 12/15/2017 08:26   IMPRESSION AND PLAN:  18 y m with a known history of advanced Alzheimer's dementia, osteoporosis, hyperlipidemia, depression, and kidney disease who presents from [facility] where he is a long-term resident for last 3 years came after a fall. Being admitted for severe hypernatremia  * Severe Hypernatremia - patient seems dehydrated. 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) - Hold donepezil  nightly.  - stop Lexapro - Avoid Ambien or other sleep aids as this can worsen confusion. Recommended to not use benzodiazepines due to risk of falls.   *  Hypotension - stop all BP meds - monitor with IVFs      All the records are reviewed and case discussed with ED provider. Management plans discussed with the patient, family (Daughter and wife at bedside) and they are in agreement.  CODE STATUS: FULL CODE  TOTAL TIME TAKING CARE OF THIS PATIENT: 45 minutes.    Delfino Lovett M.D on 12/15/2017 at 9:54 AM  Between 7am to 6pm -  Pager - [phone]  After 6pm go to [email or web address] - Social research officer, government  [employer] [facility] [place] [facility]  Office  [phone]  CC: Primary care physician; Keane Police, MD   Note: This dictation was prepared with Dragon dictation along with smaller phrase technology. Any transcriptional errors that result from this process are unintentional.

## 2017-12-15 NOTE — Plan of Care (Signed)
Pt admitted today from the ED. Disoriented x4. Unable to follow commands. Last sodium 156. Sodium level to be checked qx6hrs. No signs of pain.

## 2017-12-15 NOTE — Progress Notes (Signed)
Family Meeting Note  Advance Directive:yes  Today a meeting took place with the Patient, spouse and Daughter at bedside.  Patient is unable to participate due [id number] capacity Lethargic   The following clinical team members were present during this meeting:MD  The following were discussed:Patient's diagnosis:   84 y.o. male with a known history of advanced Alzheimer's dementia, osteoporosis, hyperlipidemia, depression, and chronic kidney disease admitted for severe hypernatremia.   Patient's progosis: < 12 months and Goals for treatment: Full Code  Additional follow-up to be provided: continue ongoing discussion with family for goals of care. Daughter not quite ready to change code status.  Time spent during discussion:20 minutes  Delfino Lovett, MD

## 2017-12-15 NOTE — Progress Notes (Signed)
[facility]  ROUNDING NOTE   Subjective:   [name] admitted to [facility] on 12/15/2017 for Hypernatremia [E87.0] Encephalopathy acute [G93.40] Injury of head, initial encounter [S09.90XA] Fall, initial encounter [W19.XXXA] Laceration of forehead, initial encounter [S01.81XA] Acute kidney injury superimposed on chronic kidney disease (HCC) [N17.9, N18.9]  Na 160.   Objective:  Vital signs in last 24 hours:  Temp:  [96.7 F (35.9 C)-98.1 F (36.7 C)] 98.1 F (36.7 C) (05/13 1116) Pulse Rate:  [56-61] 61 (05/13 1116) Resp:  [16-20] 20 (05/13 1116) BP: (103-128)/(49-64) 128/64 (05/13 1116) SpO2:  [97 %-100 %] 100 % (05/13 1000)  Weight change:  There were no vitals filed for this visit.  Intake/Output: No intake/output data recorded.   Intake/Output this shift:  Total I/O In: -  Out: 100 [Urine:100]  Physical Exam: General: Ill appearing  Head: Normocephalic, atraumatic. Dry oral mucosal membranes  Eyes: Anicteric, PERRL  Neck: Supple, trachea midline  Lungs:  Clear to auscultation  Heart: Regular rate and rhythm  Abdomen:  Soft, nontender,   Extremities: no peripheral edema.  Neurologic: lethargic  Skin: No lesions       Basic Metabolic Panel: Recent Labs  Lab 12/15/17 0736  NA 160*  K 4.5  CL 115*  CO2 35*  GLUCOSE 126*  BUN 87*  CREATININE 3.12*  CALCIUM 8.7*    Liver Function Tests: No results for input(s): AST, ALT, ALKPHOS, BILITOT, PROT, ALBUMIN in the last 168 hours. No results for input(s): LIPASE, AMYLASE in the last 168 hours. No results for input(s): AMMONIA in the last 168 hours.  CBC: Recent Labs  Lab 12/15/17 0736  WBC 9.6  HGB 12.9*  HCT 39.0*  MCV 96.5  PLT 183    Cardiac Enzymes: No results for input(s): CKTOTAL, CKMB, CKMBINDEX, TROPONINI in the last 168 hours.  BNP: Invalid input(s): POCBNP  CBG: No results for input(s): GLUCAP in the last 168 hours.  Microbiology: Results for orders placed  or performed during the hospital encounter of 12/15/17  MRSA PCR Screening     Status: None   Collection Time: 12/15/17 12:01 PM  Result Value Ref Range Status   MRSA by PCR NEGATIVE NEGATIVE Final    Comment:        The GeneXpert MRSA Assay (FDA approved for NASAL specimens only), is one component of a comprehensive MRSA colonization surveillance program. It is not intended to diagnose MRSA infection nor to guide or monitor treatment for MRSA infections. Performed at [facility], [address] [place], [place] [address]     Coagulation Studies: No results for input(s): LABPROT, INR in the last 72 hours.  Urinalysis: Recent Labs    12/15/17 0819  COLORURINE YELLOW*  LABSPEC 1.013  PHURINE 6.0  GLUCOSEU NEGATIVE  HGBUR NEGATIVE  BILIRUBINUR NEGATIVE  KETONESUR NEGATIVE  PROTEINUR NEGATIVE  NITRITE NEGATIVE  LEUKOCYTESUR NEGATIVE      Imaging: Ct Head Wo Contrast  Result Date: 12/15/2017 CLINICAL DATA:  84 year old male post fall in bathroom. Laceration forehead. Dementia. No reported loss of consciousness. Initial encounter. EXAM: CT HEAD WITHOUT CONTRAST CT CERVICAL SPINE WITHOUT CONTRAST TECHNIQUE: Multidetector CT imaging of the head and cervical spine was performed following the standard protocol without intravenous contrast. Multiplanar CT image reconstructions of the cervical spine were also generated. COMPARISON:  03/23/2017 CT. FINDINGS: CT HEAD FINDINGS Brain: No intracranial hemorrhage or CT evidence of large acute infarct. Chronic microvascular changes. 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  Moderate global atrophy. No intracranial mass lesion noted on this unenhanced exam. Vascular: Vascular calcifications. Skull: No skull fracture Sinuses/Orbits: Visualized orbits unremarkable. Mild mucosal thickening ethmoid sinus air cells on upper aspect of maxillary sinuses. Mastoid air cells and middle ear cavities  are clear. Other: Small scalp hematoma/laceration right frontal region. CT CERVICAL SPINE FINDINGS Alignment: Mild rotation of head/C1 upon C2 probably related to head positioning. Straightening of the cervical spine. Skull base and vertebrae: No cervical spine fracture noted. Soft tissues and spinal canal: No abnormal prevertebral soft tissue swelling. Disc levels: Cervical spondylotic changes with various degrees spinal stenosis and foraminal narrowing C3-4 through C6-7. Upper chest: Negative Other: Enlarged thyroid gland. IMPRESSION: Small scalp hematoma/laceration right frontal region. No skull fracture or intracranial hemorrhage. Mild rotation of head/C1 upon C2 probably related to head positioning. No cervical spine fracture noted. No abnormal prevertebral soft tissue swelling. Chronic changes otherwise as noted above. Electronically Signed   By: Lacy Duverney M.D.   On: 12/15/2017 08:26   Ct Cervical Spine Wo Contrast  Result Date: 12/15/2017 CLINICAL DATA:  84 year old male post fall in bathroom. Laceration forehead. Dementia. No reported loss of consciousness. Initial encounter. EXAM: CT HEAD WITHOUT CONTRAST CT CERVICAL SPINE WITHOUT CONTRAST TECHNIQUE: Multidetector CT imaging of the head and cervical spine was performed following the standard protocol without intravenous contrast. Multiplanar CT image reconstructions of the cervical spine were also generated. COMPARISON:  03/23/2017 CT. FINDINGS: CT HEAD FINDINGS Brain: No intracranial hemorrhage or CT evidence of large acute infarct. Chronic microvascular changes. 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 Moderate global atrophy. No intracranial mass lesion noted on this unenhanced exam. Vascular: Vascular calcifications. Skull: No skull fracture Sinuses/Orbits: Visualized orbits unremarkable. Mild mucosal thickening ethmoid sinus air cells on upper aspect of maxillary sinuses. Mastoid air cells and middle  ear cavities are clear. Other: Small scalp hematoma/laceration right frontal region. CT CERVICAL SPINE FINDINGS Alignment: Mild rotation of head/C1 upon C2 probably related to head positioning. Straightening of the cervical spine. Skull base and vertebrae: No cervical spine fracture noted. Soft tissues and spinal canal: No abnormal prevertebral soft tissue swelling. Disc levels: Cervical spondylotic changes with various degrees spinal stenosis and foraminal narrowing C3-4 through C6-7. Upper chest: Negative Other: Enlarged thyroid gland. IMPRESSION: Small scalp hematoma/laceration right frontal region. No skull fracture or intracranial hemorrhage. Mild rotation of head/C1 upon C2 probably related to head positioning. No cervical spine fracture noted. No abnormal prevertebral soft tissue swelling. Chronic changes otherwise as noted above. Electronically Signed   By: Lacy Duverney M.D.   On: 12/15/2017 08:26     Medications:   . dextrose 50 mL/hr at 12/15/17 1142   . acetaminophen  1,000 mg Oral BID  . amLODipine  5 mg Oral Daily  . aspirin EC  81 mg Oral Daily  . carvedilol  25 mg Oral BID  . cholecalciferol  1,000 Units Oral Daily  . docusate sodium  100 mg Oral BID  . escitalopram  10 mg Oral Daily  . finasteride  5 mg Oral Daily  . heparin  5,000 Units Subcutaneous Q8H  . isosorbide mononitrate  90 mg Oral Daily  . [START ON 12/16/2017] lactulose  20 g Oral QODAY  . nitrofurantoin  100 mg Oral BID  . polyethylene glycol  17 g Oral Daily  . traZODone  25 mg Oral QHS   acetaminophen **OR** acetaminophen, bisacodyl, HYDROcodone-acetaminophen, ondansetron **OR** ondansetron (ZOFRAN) IV  Assessment/ Plan:  [name]  is a 84 y.o. black male with dementia, BPH, hypertension, coronary artery disease , osteoporosis, depression  1. Hypernatremia: free water deficit of 8.5 liters!  - Start D5W at 75. - Serial sodium checks - Hold torsemide - Monitor volume status.  2. Acute renal  failure on Chronic kidney disease stage III with proteinuria: baseline creatinine 1.32, GFR of 47.  Chronic kidney disease secondary to long standing hypertension  Acute renal failure secondary to prerenal azotemia - IV fluids as above.   3. Hypertension with peripheral edema: blood pressure at goal.  - amlodipine, carvedilol, isosorbide mononitrate - Holding torsemide - Not currently taking an ACE-I/ARB (history of hyperkalemia)   LOS: 0 Jose Allen 5/13/20191:40 PM

## 2017-12-15 NOTE — ED Triage Notes (Signed)
Pt to ED via EMS from [facility] facility with c/o fall in bathroom this morning. PT has laceration to forehead. Hx of dementia , per facility more lethargic than usual. MD at bedside.

## 2017-12-16 LAB — SODIUM
SODIUM: 156 mmol/L — AB (ref 135–145)
SODIUM: 155 mmol/L — AB (ref 135–145)
Sodium: 156 mmol/L — ABNORMAL HIGH (ref 135–145)
Sodium: 157 mmol/L — ABNORMAL HIGH (ref 135–145)

## 2017-12-16 LAB — BASIC METABOLIC PANEL
Anion gap: 6 (ref 5–15)
BUN: 63 mg/dL — ABNORMAL HIGH (ref 6–20)
CO2: 32 mmol/L (ref 22–32)
Calcium: 8.4 mg/dL — ABNORMAL LOW (ref 8.9–10.3)
Chloride: 119 mmol/L — ABNORMAL HIGH (ref 101–111)
Creatinine, Ser: 2.45 mg/dL — ABNORMAL HIGH (ref 0.61–1.24)
GFR calc Af Amer: 25 mL/min — ABNORMAL LOW (ref >60)
GFR calc non Af Amer: 22 mL/min — ABNORMAL LOW (ref >60)
Glucose, Bld: 113 mg/dL — ABNORMAL HIGH (ref 65–99)
Potassium: 3.5 mmol/L (ref 3.5–5.1)
Sodium: 157 mmol/L — ABNORMAL HIGH (ref 135–145)

## 2017-12-16 LAB — CBC
HEMATOCRIT: 37 % — AB (ref 40.0–52.0)
HEMOGLOBIN: 12.1 g/dL — AB (ref 13.0–18.0)
MCH: 31.5 pg (ref 26.0–34.0)
MCHC: 32.7 g/dL (ref 32.0–36.0)
MCV: 96.6 fL (ref 80.0–100.0)
Platelets: 187 10*3/uL (ref 150–440)
RBC: 3.83 MIL/uL — AB (ref 4.40–5.90)
RDW: 14 % (ref 11.5–14.5)
WBC: 10.7 10*3/uL — AB (ref 3.8–10.6)

## 2017-12-16 LAB — GLUCOSE, CAPILLARY
GLUCOSE-CAPILLARY: 65 mg/dL (ref 65–99)
Glucose-Capillary: 98 mg/dL (ref 65–99)

## 2017-12-16 MED ORDER — ENSURE ENLIVE PO LIQD
237.0000 mL | Freq: Two times a day (BID) | ORAL | Status: DC
  Administered 2017-12-17 – 2017-12-18 (×3): 237 mL via ORAL

## 2017-12-16 NOTE — Plan of Care (Signed)
  Problem: Education: Goal: Knowledge of General Education information will improve Outcome: Progressing   Problem: Health Behavior/Discharge Planning: Goal: Ability to manage health-related needs will improve Outcome: Progressing   Problem: Clinical Measurements: Goal: Ability to maintain clinical measurements within normal limits will improve Outcome: Progressing Goal: Will remain free from infection Outcome: Progressing Goal: Diagnostic test results will improve Outcome: Progressing Goal: Respiratory complications will improve Outcome: Progressing Goal: Cardiovascular complication will be avoided Outcome: Progressing   Problem: Safety: Goal: Ability to remain free from injury will improve Outcome: Progressing   

## 2017-12-16 NOTE — Progress Notes (Signed)
Initial Nutrition Assessment  DOCUMENTATION CODES:   Not applicable  INTERVENTION:  Will downgrade diet to dysphagia 1 (pureed) with thin liquids as this is the diet he is on at [facility].  Provide Ensure Enlive po BID, each supplement provides 350 kcal and 20 grams of protein.  Provide Magic cup TID with meals, each supplement provides 290 kcal and 9 grams of protein.  Continue vitamin D 1000 IU daily in setting of vitamin D deficiency.  NUTRITION DIAGNOSIS:   Biting/chewing difficulty related to dysphagia(oral phase) as evidenced by other (comment)(per documentation from [facility], need for pureed food).  GOAL:   Patient will meet greater than or equal to 90% of their needs  MONITOR:   PO intake, Supplement acceptance, Labs, Weight trends, I & O's, Skin  REASON FOR ASSESSMENT:   Malnutrition Screening Tool    ASSESSMENT:   84 year old male with PMHx of advanced Alzheimer's dementia, BPH, vitamin D Deficiency, constipation, GERD, MDD, insomnia, oral phase dysphagia, OP, renal disorder who is now admitted from [facility] after a fall found to have severe hypernatremia from dehydration with 8.5 L free water deficit, AKI on CKD 3, hypotension.   Met with patient at bedside. He was unable to provide any nutrition or weight history. No family members present at time of RD assessment. According to his chart sent over from [facility], he is typically on a dysphagia 1 (pureed) diet with thin liquids due to oral phase dysphagia. He takes vitamin D 1000 IU daily in setting of vitamin D deficiency. He also takes House Supplement 2.0 120 mL BID. He takes Miralax daily for constipation. It appears that his admission weight to [facility] was around 186 lbs in 2016. Unsure what his current weight is. According to his documentation from [facility] he occasionally has edema, so his weight may fluctuate.  Meal Completion: bites of breakfast this morning  Medications  reviewed and include: vitamin D 1000 units daily, Colace, lactulose, Miralax, D5W at 125 mL/hr (150 grams dextrose, 510 kcal daily).  Labs reviewed: CBG 65-98, Sodium 157, Chloride 119, BUN 63, Creatinine 2.45.  Unable to tell if patient meets criteria for malnutrition due to limited assessment. He is certainly at risk for malnutrition.  Discussed with RN.  NUTRITION - FOCUSED PHYSICAL EXAM:  Unable to complete NFPE as patient was not able to cooperate for exam.  Diet Order:   Diet Order           DIET SOFT Room service appropriate? Yes; Fluid consistency: Thin  Diet effective now          EDUCATION NEEDS:   Not appropriate for education at this time  Skin:  Skin Assessment: Reviewed RN Assessment(laceration to forehead)  Last BM:  Unknown  Height:   Ht Readings from Last 1 Encounters:  12/16/17 6' (1.829 m)    Weight:   Wt Readings from Last 1 Encounters:  12/16/17 198 lb (89.8 kg)    Ideal Body Weight:  80.9 kg  BMI:  Body mass index is 26.85 kg/m.  Estimated Nutritional Needs:   Kcal:  1800-2000  Protein:  90-100 grams  Fluid:  1.8-2 L/day  Willey Blade, MS, RD, LDN Office: [phone] Pager: [phone] After Hours/Weekend Pager: [phone]

## 2017-12-16 NOTE — Progress Notes (Signed)
[facility] [place] at [facility]   PATIENT NAME: Jose Allen    MR#:  [id number]  DATE OF BIRTH:  [birth year]  SUBJECTIVE:   Patient has advanced dementia. Unable to provide any review of systems. Ate a little bit breakfast per nurse. No other issues. Resting quietly. REVIEW OF SYSTEMS:   Review of Systems  Unable to perform ROS: Dementia   Tolerating Diet: yes Tolerating PT: pending  DRUG ALLERGIES:   Allergies  Allergen Reactions  . Terazosin     Other reaction(s): Other (See Comments) NEAR CARDIAC ARREST  . Depakote [Divalproex Sodium]     Tremors, gait abnormality  . Enalapril   . Flomax [Tamsulosin Hcl]   . Hydrochlorothiazide   . Metoprolol     VITALS:  Blood pressure 113/86, pulse (!) 55, temperature 98.3 F (36.8 C), temperature source Oral, resp. rate 17, height 6' (1.829 m), weight 89.8 kg (198 lb), SpO2 96 %.  PHYSICAL EXAMINATION:   Physical Exam  GENERAL:  84 y.o.-year-old patient lying in the bed with no acute distress.  EYES: Pupils equal, round, reactive to light and accommodation.  HEENT: Head atraumatic, normocephalic. Oropharynx and nasopharynx clear.  NECK:  Supple, no jugular venous distention. No thyroid enlargement, no tenderness.  LUNGS: Normal breath sounds bilaterally, no wheezing, rales, rhonchi. No use of accessory muscles of respiration.  CARDIOVASCULAR: S1, S2 normal. No murmurs, rubs, or gallops.  ABDOMEN: Soft, nontender, nondistended. Bowel sounds present. No organomegaly or mass.  EXTREMITIES: No cyanosis, clubbing or edema b/l.    NEUROLOGIC: moves extremity strong spontaneously. Limited exam secondary to advanced dementia PSYCHIATRIC:  patient opens eyes on verbal commands but does not communicate much  SKIN: No obvious rash, lesion, or ulcer.   LABORATORY PANEL:  CBC Recent Labs  Lab 12/16/17 0455  WBC 10.7*  HGB 12.1*  HCT 37.0*  PLT 187    Chemistries  Recent Labs  Lab 12/16/17 0455   12/16/17 1350  NA 157*   < > 157*  K 3.5  --   --   CL 119*  --   --   CO2 32  --   --   GLUCOSE 113*  --   --   BUN 63*  --   --   CREATININE 2.45*  --   --   CALCIUM 8.4*  --   --    < > = values in this interval not displayed.   Cardiac Enzymes No results for input(s): TROPONINI in the last 168 hours. RADIOLOGY:  Ct Head Wo Contrast  Result Date: 12/15/2017 CLINICAL DATA:  84 year old male post fall in bathroom. Laceration forehead. Dementia. No reported loss of consciousness. Initial encounter. EXAM: CT HEAD WITHOUT CONTRAST CT CERVICAL SPINE WITHOUT CONTRAST TECHNIQUE: Multidetector CT imaging of the head and cervical spine was performed following the standard protocol without intravenous contrast. Multiplanar CT image reconstructions of the cervical spine were also generated. COMPARISON:  03/23/2017 CT. FINDINGS: CT HEAD FINDINGS Brain: No intracranial hemorrhage or CT evidence of large acute infarct. Chronic microvascular changes. 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 Moderate global atrophy. No intracranial mass lesion noted on this unenhanced exam. Vascular: Vascular calcifications. Skull: No skull fracture Sinuses/Orbits: Visualized orbits unremarkable. Mild mucosal thickening ethmoid sinus air cells on upper aspect of maxillary sinuses. Mastoid air cells and middle ear cavities are clear. Other: Small scalp hematoma/laceration right frontal region. CT CERVICAL SPINE FINDINGS Alignment: Mild rotation of head/C1 upon C2 probably  related to head positioning. Straightening of the cervical spine. Skull base and vertebrae: No cervical spine fracture noted. Soft tissues and spinal canal: No abnormal prevertebral soft tissue swelling. Disc levels: Cervical spondylotic changes with various degrees spinal stenosis and foraminal narrowing C3-4 through C6-7. Upper chest: Negative Other: Enlarged thyroid gland. IMPRESSION: Small scalp hematoma/laceration  right frontal region. No skull fracture or intracranial hemorrhage. Mild rotation of head/C1 upon C2 probably related to head positioning. No cervical spine fracture noted. No abnormal prevertebral soft tissue swelling. Chronic changes otherwise as noted above. Electronically Signed   By: Lacy Duverney M.D.   On: 12/15/2017 08:26   Ct Cervical Spine Wo Contrast  Result Date: 12/15/2017 CLINICAL DATA:  84 year old male post fall in bathroom. Laceration forehead. Dementia. No reported loss of consciousness. Initial encounter. EXAM: CT HEAD WITHOUT CONTRAST CT CERVICAL SPINE WITHOUT CONTRAST TECHNIQUE: Multidetector CT imaging of the head and cervical spine was performed following the standard protocol without intravenous contrast. Multiplanar CT image reconstructions of the cervical spine were also generated. COMPARISON:  03/23/2017 CT. FINDINGS: CT HEAD FINDINGS Brain: No intracranial hemorrhage or CT evidence of large acute infarct. Chronic microvascular changes. 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 Moderate global atrophy. No intracranial mass lesion noted on this unenhanced exam. Vascular: Vascular calcifications. Skull: No skull fracture Sinuses/Orbits: Visualized orbits unremarkable. Mild mucosal thickening ethmoid sinus air cells on upper aspect of maxillary sinuses. Mastoid air cells and middle ear cavities are clear. Other: Small scalp hematoma/laceration right frontal region. CT CERVICAL SPINE FINDINGS Alignment: Mild rotation of head/C1 upon C2 probably related to head positioning. Straightening of the cervical spine. Skull base and vertebrae: No cervical spine fracture noted. Soft tissues and spinal canal: No abnormal prevertebral soft tissue swelling. Disc levels: Cervical spondylotic changes with various degrees spinal stenosis and foraminal narrowing C3-4 through C6-7. Upper chest: Negative Other: Enlarged thyroid gland. IMPRESSION: Small scalp  hematoma/laceration right frontal region. No skull fracture or intracranial hemorrhage. Mild rotation of head/C1 upon C2 probably related to head positioning. No cervical spine fracture noted. No abnormal prevertebral soft tissue swelling. Chronic changes otherwise as noted above. Electronically Signed   By: Lacy Duverney M.D.   On: 12/15/2017 08:26   ASSESSMENT AND PLAN:  74 y m with a known history of advanced Alzheimer's dementia, osteoporosis, hyperlipidemia, depression, and kidney disease who presents from [facility] where he is a long-term resident for last 3 years came after a fall. Being admitted for severe hypernatremia  * Severe Hypernatremia - patient seems dehydrated. 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  - stop Lexapro - Avoid Ambien or other sleep aids as this can worsen confusion. Recommended to not use benzodiazepines due to risk of falls.   * Hypotension - stop all BP meds - monitor with IVFs  Case discussed with Care Management/Social Worker. Management plans discussed with the patient, family and they are in agreement.  CODE STATUS: full  DVT Prophylaxis: heparin  TOTAL TIME TAKING CARE OF THIS PATIENT: 25 minutes.  >50% time spent on counselling and coordination of care  POSSIBLE D/C IN few  DAYS, DEPENDING ON CLINICAL CONDITION.  Note: This dictation was prepared with Dragon dictation along with smaller phrase technology. Any transcriptional errors that result from this process are unintentional.  Enedina Finner M.D on 12/16/2017 at 2:34 PM  Between 7am to 6pm - Pager - [phone]  After 6pm go to  [email or web address] - Social research officer, government  [facility]  Office  [phone]  CC: Primary care physician; Keane Police, MDPatient ID: Jose Allen, male   DOB: October 24, 1925, 84 y.o.   MRN: [id number]

## 2017-12-16 NOTE — [place] FL2 (Signed)
[place] MEDICAID FL2 LEVEL OF CARE SCREENING TOOL     IDENTIFICATION  Patient Name: Jose Allen Birthdate: 14-Oct-1925 Sex: male Admission Date (Current Location): 12/15/2017  [facility] and [place] Number:  Chiropodist and Address:  [facility], [address], [place], [place] [address]      Provider Number: [id number]  Attending Physician Name and Address:  Enedina Finner, MD  Relative Name and Phone Number:       Current Level of Care: Hospital Recommended Level of Care: Skilled Nursing Facility Prior Approval Number:    Date Approved/Denied:   PASRR Number:    Discharge Plan: SNF    Current Diagnoses: Patient Active Problem List   Diagnosis Date Noted  . Hyponatremia 12/15/2017    Orientation RESPIRATION BLADDER Height & Weight     Self  Normal Incontinent Weight: 198 lb (89.8 kg) Height:  6' (182.9 cm)  BEHAVIORAL SYMPTOMS/MOOD NEUROLOGICAL BOWEL NUTRITION STATUS  (none) (None) Continent Diet(Mech Soft )  AMBULATORY STATUS COMMUNICATION OF NEEDS Skin   Extensive Assist Verbally                         Personal Care Assistance Level of Assistance  Bathing, Feeding, Dressing Bathing Assistance: Limited assistance Feeding assistance: Independent Dressing Assistance: Limited assistance     Functional Limitations Info  Sight, Hearing, Speech Sight Info: Adequate Hearing Info: Adequate Speech Info: Adequate    SPECIAL CARE FACTORS FREQUENCY  PT (By licensed PT), OT (By licensed OT)                    Contractures Contractures Info: Not present    Additional Factors Info  Code Status, Allergies Code Status Info: FULL Allergies Info: Hydrochlorothiazide, Terazosin, Depakote, Metoprolol, Enalapril, Flomax            Current Medications (12/16/2017):  This is the current hospital active medication list Current Facility-Administered Medications  Medication Dose Route Frequency Provider Last Rate Last  Dose  . acetaminophen (TYLENOL) tablet 650 mg  650 mg Oral Q6H PRN Delfino Lovett, MD       Or  . acetaminophen (TYLENOL) suppository 650 mg  650 mg Rectal Q6H PRN Delfino Lovett, MD      . acetaminophen (TYLENOL) tablet 1,000 mg  1,000 mg Oral BID Delfino Lovett, MD   1,000 mg at 12/15/17 2231  . aspirin EC tablet 81 mg  81 mg Oral Daily Delfino Lovett, MD   81 mg at 12/15/17 1454  . bisacodyl (DULCOLAX) EC tablet 5 mg  5 mg Oral Daily PRN Delfino Lovett, MD      . cholecalciferol (VITAMIN D) tablet 1,000 Units  1,000 Units Oral Daily Delfino Lovett, MD   1,000 Units at 12/15/17 1455  . dextrose 5 % solution   Intravenous Continuous Kolluru, Sarath, MD 100 mL/hr at 12/16/17 0900    . docusate sodium (COLACE) capsule 100 mg  100 mg Oral BID Delfino Lovett, MD   100 mg at 12/15/17 2231  . finasteride (PROSCAR) tablet 5 mg  5 mg Oral Daily Delfino Lovett, MD   5 mg at 12/15/17 1455  . heparin injection 5,000 Units  5,000 Units Subcutaneous Q8H Delfino Lovett, MD   5,000 Units at 12/16/17 0439  . lactulose (CHRONULAC) 10 GM/15ML solution 20 g  20 g Oral Gwenyth Ober, MD      . MEDLINE mouth rinse  15 mL Mouth Rinse BID Delfino Lovett, MD  15 mL at 12/15/17 2232  . ondansetron (ZOFRAN) tablet 4 mg  4 mg Oral Q6H PRN Delfino Lovett, MD       Or  . ondansetron (ZOFRAN) injection 4 mg  4 mg Intravenous Q6H PRN Sherryll Burger, Vipul, MD      . polyethylene glycol (MIRALAX / GLYCOLAX) packet 17 g  17 g Oral Daily Sherryll Burger, Vipul, MD   17 g at 12/15/17 1501  . traZODone (DESYREL) tablet 25 mg  25 mg Oral QHS Delfino Lovett, MD   25 mg at 12/15/17 2229     Discharge Medications: Please see discharge summary for a list of discharge medications.  Relevant Imaging Results:  Relevant Lab Results:   Additional Information    Zalika Tieszen  Rinaldo Ratel, [address]

## 2017-12-16 NOTE — Clinical Social Work Note (Signed)
Clinical Social Work Assessment  Patient Details  Name: Jose Allen MRN: [id number] Date of Birth: 1925-09-02  Date of referral:  12/16/17               Reason for consult:  Facility Placement                Permission sought to share information with:  Family Supports, Magazine features editor, Case Estate manager/land agent granted to share information::  Yes, Verbal Permission Granted  Name::      [employer]::     Relationship::     Contact Information:     Housing/Transportation Living arrangements for the past 2 months:  Skilled Building surveyor of Information:   daughter  Patient Interpreter Needed:  None Criminal Activity/Legal Involvement Pertinent to Current Situation/Hospitalization:  No - Comment as needed Significant Relationships:  Spouse and daughter  Lives with:  Facility Resident Do you feel safe going back to the place where you live?  Yes Need for family participation in patient care:  Yes (Comment)  Care giving concerns:  Patient is a long term resident at [facility]    Social Worker assessment / plan:  CSW received consult due to patient being from SNF. CSW attempted to talk with patient but he is only alert to self and only repeats his name. CSW contacted patient's daughter Jose Allen [phone]. Daughter states that patient has been a resident at [facility] for about 3 years. Daughter would like for patient to return to [facility] when able. CSW contacted [employer] for [facility]. Jose Allen reports that patient has shown more confusion recently but has had Dementia for a while. Jose Allen states that patient attempts to be independent but usually requires some assistance with ADLs. Jose Allen states that patient can return when ready for discharge. CSW will continue to follow for discharge needs.   Employment status:  Retired Database administrator PT Recommendations:  Skilled Nursing  Facility Information / Referral to community resources:  Skilled Nursing Facility  Patient/Family's Response to care:  Patient has Dementia and is unable to be assessed   Patient/Family's Understanding of and Emotional Response to Diagnosis, Current Treatment, and Prognosis:  Patient's daughter Jose Allen is aware of patient's prognosis and is supportive.   Emotional Assessment Appearance:  Appears stated age Attitude/Demeanor/Rapport:    Affect (typically observed):  Restless Orientation:  Oriented to Self Alcohol / Substance use:  Not Applicable Psych involvement (Current and /or in the community):  No (Comment)  Discharge Needs  Concerns to be addressed:  Discharge Planning Concerns Readmission within the last 30 days:  No Current discharge risk:  None Barriers to Discharge:  Continued Medical Work up   [employer], LCSWA 12/16/2017, 2:34 PM

## 2017-12-16 NOTE — Progress Notes (Signed)
[facility]  ROUNDING NOTE   Subjective:   No family at bedside.   Na 157  D5W at 51mL/hr  Objective:  Vital signs in last 24 hours:  Temp:  [98.1 F (36.7 C)-98.3 F (36.8 C)] 98.3 F (36.8 C) (05/14 0423) Pulse Rate:  [55-59] 55 (05/14 0423) Resp:  [17] 17 (05/14 0423) BP: (91-113)/(58-86) 113/86 (05/14 0423) SpO2:  [96 %-100 %] 96 % (05/14 0423) Weight:  [89.8 kg (198 lb)] 89.8 kg (198 lb) (05/14 0504)  Weight change:  Filed Weights   12/16/17 0423 12/16/17 0504  Weight: 89.8 kg (198 lb) 89.8 kg (198 lb)    Intake/Output: I/O last 3 completed shifts: In: 848.3 [I.V.:848.3] Out: 100 [Urine:100]   Intake/Output this shift:  Total I/O In: 120 [P.O.:120] Out: -   Physical Exam: General: Ill appearing  Head: Normocephalic, atraumatic. Dry oral mucosal membranes  Eyes: Anicteric, PERRL  Neck: Supple, trachea midline  Lungs:  Clear to auscultation  Heart: Regular rate and rhythm  Abdomen:  Soft, nontender,   Extremities: no peripheral edema.  Neurologic: lethargic  Skin: No lesions       Basic Metabolic Panel: Recent Labs  Lab 12/15/17 0736  12/15/17 1943 12/16/17 0153 12/16/17 0455 12/16/17 0802 12/16/17 1350  NA 160*   < > 157* 156* 157* 156* 157*  K 4.5  --   --   --  3.5  --   --   CL 115*  --   --   --  119*  --   --   CO2 35*  --   --   --  32  --   --   GLUCOSE 126*  --   --   --  113*  --   --   BUN 87*  --   --   --  63*  --   --   CREATININE 3.12*  --   --   --  2.45*  --   --   CALCIUM 8.7*  --   --   --  8.4*  --   --    < > = values in this interval not displayed.    Liver Function Tests: No results for input(s): AST, ALT, ALKPHOS, BILITOT, PROT, ALBUMIN in the last 168 hours. No results for input(s): LIPASE, AMYLASE in the last 168 hours. No results for input(s): AMMONIA in the last 168 hours.  CBC: Recent Labs  Lab 12/15/17 0736 12/16/17 0455  WBC 9.6 10.7*  HGB 12.9* 12.1*  HCT 39.0* 37.0*  MCV 96.5 96.6   PLT 183 187    Cardiac Enzymes: No results for input(s): CKTOTAL, CKMB, CKMBINDEX, TROPONINI in the last 168 hours.  BNP: Invalid input(s): POCBNP  CBG: Recent Labs  Lab 12/16/17 0745 12/16/17 0839  GLUCAP 65 98    Microbiology: Results for orders placed or performed during the hospital encounter of 12/15/17  MRSA PCR Screening     Status: None   Collection Time: 12/15/17 12:01 PM  Result Value Ref Range Status   MRSA by PCR NEGATIVE NEGATIVE Final    Comment:        The GeneXpert MRSA Assay (FDA approved for NASAL specimens only), is one component of a comprehensive MRSA colonization surveillance program. It is not intended to diagnose MRSA infection nor to guide or monitor treatment for MRSA infections. Performed at [facility], [address], [place], [place] [address]     Coagulation Studies: No results for input(s):  LABPROT, INR in the last 72 hours.  Urinalysis: Recent Labs    12/15/17 0819  COLORURINE YELLOW*  LABSPEC 1.013  PHURINE 6.0  GLUCOSEU NEGATIVE  HGBUR NEGATIVE  BILIRUBINUR NEGATIVE  KETONESUR NEGATIVE  PROTEINUR NEGATIVE  NITRITE NEGATIVE  LEUKOCYTESUR NEGATIVE      Imaging: Ct Head Wo Contrast  Result Date: 12/15/2017 CLINICAL DATA:  84 year old male post fall in bathroom. Laceration forehead. Dementia. No reported loss of consciousness. Initial encounter. EXAM: CT HEAD WITHOUT CONTRAST CT CERVICAL SPINE WITHOUT CONTRAST TECHNIQUE: Multidetector CT imaging of the head and cervical spine was performed following the standard protocol without intravenous contrast. Multiplanar CT image reconstructions of the cervical spine were also generated. COMPARISON:  03/23/2017 CT. FINDINGS: CT HEAD FINDINGS Brain: No intracranial hemorrhage or CT evidence of large acute infarct. Chronic microvascular changes. 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 Moderate global atrophy. No intracranial mass  lesion noted on this unenhanced exam. Vascular: Vascular calcifications. Skull: No skull fracture Sinuses/Orbits: Visualized orbits unremarkable. Mild mucosal thickening ethmoid sinus air cells on upper aspect of maxillary sinuses. Mastoid air cells and middle ear cavities are clear. Other: Small scalp hematoma/laceration right frontal region. CT CERVICAL SPINE FINDINGS Alignment: Mild rotation of head/C1 upon C2 probably related to head positioning. Straightening of the cervical spine. Skull base and vertebrae: No cervical spine fracture noted. Soft tissues and spinal canal: No abnormal prevertebral soft tissue swelling. Disc levels: Cervical spondylotic changes with various degrees spinal stenosis and foraminal narrowing C3-4 through C6-7. Upper chest: Negative Other: Enlarged thyroid gland. IMPRESSION: Small scalp hematoma/laceration right frontal region. No skull fracture or intracranial hemorrhage. Mild rotation of head/C1 upon C2 probably related to head positioning. No cervical spine fracture noted. No abnormal prevertebral soft tissue swelling. Chronic changes otherwise as noted above. Electronically Signed   By: Lacy Duverney M.D.   On: 12/15/2017 08:26   Ct Cervical Spine Wo Contrast  Result Date: 12/15/2017 CLINICAL DATA:  84 year old male post fall in bathroom. Laceration forehead. Dementia. No reported loss of consciousness. Initial encounter. EXAM: CT HEAD WITHOUT CONTRAST CT CERVICAL SPINE WITHOUT CONTRAST TECHNIQUE: Multidetector CT imaging of the head and cervical spine was performed following the standard protocol without intravenous contrast. Multiplanar CT image reconstructions of the cervical spine were also generated. COMPARISON:  03/23/2017 CT. FINDINGS: CT HEAD FINDINGS Brain: No intracranial hemorrhage or CT evidence of large acute infarct. Chronic microvascular changes. 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 Moderate global atrophy. No  intracranial mass lesion noted on this unenhanced exam. Vascular: Vascular calcifications. Skull: No skull fracture Sinuses/Orbits: Visualized orbits unremarkable. Mild mucosal thickening ethmoid sinus air cells on upper aspect of maxillary sinuses. Mastoid air cells and middle ear cavities are clear. Other: Small scalp hematoma/laceration right frontal region. CT CERVICAL SPINE FINDINGS Alignment: Mild rotation of head/C1 upon C2 probably related to head positioning. Straightening of the cervical spine. Skull base and vertebrae: No cervical spine fracture noted. Soft tissues and spinal canal: No abnormal prevertebral soft tissue swelling. Disc levels: Cervical spondylotic changes with various degrees spinal stenosis and foraminal narrowing C3-4 through C6-7. Upper chest: Negative Other: Enlarged thyroid gland. IMPRESSION: Small scalp hematoma/laceration right frontal region. No skull fracture or intracranial hemorrhage. Mild rotation of head/C1 upon C2 probably related to head positioning. No cervical spine fracture noted. No abnormal prevertebral soft tissue swelling. Chronic changes otherwise as noted above. Electronically Signed   By: Lacy Duverney M.D.   On: 12/15/2017 08:26  Medications:   . dextrose 125 mL/hr at 12/16/17 1145   . acetaminophen  1,000 mg Oral BID  . aspirin EC  81 mg Oral Daily  . cholecalciferol  1,000 Units Oral Daily  . docusate sodium  100 mg Oral BID  . [START ON 12/17/2017] feeding supplement (ENSURE ENLIVE)  237 mL Oral BID BM  . finasteride  5 mg Oral Daily  . heparin  5,000 Units Subcutaneous Q8H  . lactulose  20 g Oral QODAY  . mouth rinse  15 mL Mouth Rinse BID  . polyethylene glycol  17 g Oral Daily  . traZODone  25 mg Oral QHS   acetaminophen **OR** acetaminophen, bisacodyl, ondansetron **OR** ondansetron (ZOFRAN) IV  Assessment/ Plan:  Mr. Gustin Zobrist is a 84 y.o. black male with dementia, BPH, hypertension, coronary artery disease , osteoporosis,  depression  1. Hypernatremia: free water deficit improved to 6.5 liters.  - Increase D5W to 173mL/hr - Serial sodium checks - Hold torsemide - Monitor volume status.  2. Acute renal failure on Chronic kidney disease stage III with proteinuria: baseline creatinine 1.32, GFR of 47.  Chronic kidney disease secondary to long standing hypertension  Acute renal failure secondary to prerenal azotemia - IV fluids as above.   3. Hypertension with peripheral edema: blood pressure at goal.  - amlodipine, carvedilol, isosorbide mononitrate - Holding torsemide - Not currently taking an ACE-I/ARB (history of hyperkalemia)  4. Urinary tract infection - completed course of nitrofuratoin.    LOS: 1 Elisia Stepp 5/14/20192:58 PM

## 2017-12-17 ENCOUNTER — Encounter: Payer: Self-pay | Admitting: *Deleted

## 2017-12-17 LAB — SODIUM
SODIUM: 150 mmol/L — AB (ref 135–145)
Sodium: 152 mmol/L — ABNORMAL HIGH (ref 135–145)
Sodium: 153 mmol/L — ABNORMAL HIGH (ref 135–145)

## 2017-12-17 LAB — GLUCOSE, CAPILLARY: GLUCOSE-CAPILLARY: 124 mg/dL — AB (ref 65–99)

## 2017-12-17 MED ORDER — DOCUSATE SODIUM 50 MG/5ML PO LIQD
100.0000 mg | Freq: Two times a day (BID) | ORAL | Status: DC
  Filled 2017-12-17 (×3): qty 10

## 2017-12-17 NOTE — Plan of Care (Signed)
  Problem: Education: Goal: Knowledge of General Education information will improve Outcome: Progressing   Problem: Health Behavior/Discharge Planning: Goal: Ability to manage health-related needs will improve Outcome: Progressing   Problem: Clinical Measurements: Goal: Ability to maintain clinical measurements within normal limits will improve Outcome: Progressing Goal: Will remain free from infection Outcome: Progressing Goal: Diagnostic test results will improve Outcome: Progressing Goal: Respiratory complications will improve Outcome: Progressing Goal: Cardiovascular complication will be avoided Outcome: Progressing   Problem: Safety: Goal: Ability to remain free from injury will improve Outcome: Progressing

## 2017-12-17 NOTE — Progress Notes (Signed)
[facility]  ROUNDING NOTE   Subjective:   More alert. Ate some breakfast this morning.   Na 156  D5W at 167mL/hr  Objective:  Vital signs in last 24 hours:  Temp:  [97 F (36.1 C)-98.7 F (37.1 C)] 98.6 F (37 C) (05/15 1344) Pulse Rate:  [51-76] 76 (05/15 1344) Resp:  [17-18] 18 (05/15 1344) BP: (110-129)/(51-84) 112/54 (05/15 1344) SpO2:  [98 %-100 %] 98 % (05/15 1344) Weight:  [93.9 kg (207 lb)] 93.9 kg (207 lb) (05/15 0534)  Weight change: 4.082 kg (9 lb) Filed Weights   12/16/17 0423 12/16/17 0504 12/17/17 0534  Weight: 89.8 kg (198 lb) 89.8 kg (198 lb) 93.9 kg (207 lb)    Intake/Output: I/O last 3 completed shifts: In: 3816.3 [P.O.:120; I.V.:3696.3] Out: 525 [Urine:525]   Intake/Output this shift:  No intake/output data recorded.  Physical Exam: General: Ill appearing  Head: Normocephalic, atraumatic. Dry oral mucosal membranes  Eyes: Anicteric, PERRL  Neck: Supple, trachea midline  Lungs:  Clear to auscultation  Heart: Regular rate and rhythm  Abdomen:  Soft, nontender,   Extremities: no peripheral edema.  Neurologic: lethargic  Skin: No lesions       Basic Metabolic Panel: Recent Labs  Lab 12/15/17 0736  12/16/17 0455 12/16/17 0802 12/16/17 1350 12/16/17 2013 12/17/17 0206 12/17/17 0740  NA 160*   < > 157* 156* 157* 155* 152* 153*  K 4.5  --  3.5  --   --   --   --   --   CL 115*  --  119*  --   --   --   --   --   CO2 35*  --  32  --   --   --   --   --   GLUCOSE 126*  --  113*  --   --   --   --   --   BUN 87*  --  63*  --   --   --   --   --   CREATININE 3.12*  --  2.45*  --   --   --   --   --   CALCIUM 8.7*  --  8.4*  --   --   --   --   --    < > = values in this interval not displayed.    Liver Function Tests: No results for input(s): AST, ALT, ALKPHOS, BILITOT, PROT, ALBUMIN in the last 168 hours. No results for input(s): LIPASE, AMYLASE in the last 168 hours. No results for input(s): AMMONIA in the last 168  hours.  CBC: Recent Labs  Lab 12/15/17 0736 12/16/17 0455  WBC 9.6 10.7*  HGB 12.9* 12.1*  HCT 39.0* 37.0*  MCV 96.5 96.6  PLT 183 187    Cardiac Enzymes: No results for input(s): CKTOTAL, CKMB, CKMBINDEX, TROPONINI in the last 168 hours.  BNP: Invalid input(s): POCBNP  CBG: Recent Labs  Lab 12/16/17 0745 12/16/17 0839 12/17/17 0754  GLUCAP 65 98 124*    Microbiology: Results for orders placed or performed during the hospital encounter of 12/15/17  MRSA PCR Screening     Status: None   Collection Time: 12/15/17 12:01 PM  Result Value Ref Range Status   MRSA by PCR NEGATIVE NEGATIVE Final    Comment:        The GeneXpert MRSA Assay (FDA approved for NASAL specimens only), is one component of a comprehensive MRSA colonization surveillance program. It is not  intended to diagnose MRSA infection nor to guide or monitor treatment for MRSA infections. Performed at [facility], [address] [place], [place] [address]     Coagulation Studies: No results for input(s): LABPROT, INR in the last 72 hours.  Urinalysis: Recent Labs    12/15/17 0819  COLORURINE YELLOW*  LABSPEC 1.013  PHURINE 6.0  GLUCOSEU NEGATIVE  HGBUR NEGATIVE  BILIRUBINUR NEGATIVE  KETONESUR NEGATIVE  PROTEINUR NEGATIVE  NITRITE NEGATIVE  LEUKOCYTESUR NEGATIVE      Imaging: No results found.   Medications:   . dextrose 125 mL/hr at 12/17/17 0851   . acetaminophen  1,000 mg Oral BID  . aspirin EC  81 mg Oral Daily  . cholecalciferol  1,000 Units Oral Daily  . docusate sodium  100 mg Oral BID  . feeding supplement (ENSURE ENLIVE)  237 mL Oral BID BM  . finasteride  5 mg Oral Daily  . heparin  5,000 Units Subcutaneous Q8H  . lactulose  20 g Oral QODAY  . mouth rinse  15 mL Mouth Rinse BID  . polyethylene glycol  17 g Oral Daily  . traZODone  25 mg Oral QHS   acetaminophen **OR** acetaminophen, bisacodyl, ondansetron **OR** ondansetron (ZOFRAN) IV  Assessment/  Plan:  [name] is a 84 y.o. black male with dementia, BPH, hypertension, coronary artery disease , osteoporosis, depression  1. Hypernatremia: free water deficit improved - Continue D5W  - Serial sodium checks - Hold torsemide - Monitor volume status.  2. Acute renal failure on Chronic kidney disease stage III with proteinuria: baseline creatinine 1.32, GFR of 47.  Chronic kidney disease secondary to long standing hypertension  Acute renal failure secondary to prerenal azotemia - IV fluids as above.  - labs for tomorrow.   3. Hypertension with peripheral edema: blood pressure at goal.  - amlodipine, carvedilol, isosorbide mononitrate - Holding torsemide - Not currently taking an ACE-I/ARB (history of hyperkalemia)  4. Urinary tract infection - completed course of nitrofuratoin.    LOS: 2 Jose Allen 5/15/20192:33 PM

## 2017-12-17 NOTE — Progress Notes (Addendum)
[facility] - [place] [facility]   PATIENT NAME: Jose Allen    MR#:  [id number]  DATE OF BIRTH:  [birth year]  SUBJECTIVE:   Patient has advanced dementia. Unable to provide any review of systems. Ate a little bit breakfast per nurse. No other issues. More awake and talking some REVIEW OF SYSTEMS:   Review of Systems  Unable to perform ROS: Dementia   Tolerating Diet: yes   DRUG ALLERGIES:   Allergies  Allergen Reactions  . Terazosin     Other reaction(s): Other (See Comments) NEAR CARDIAC ARREST  . Depakote [Divalproex Sodium]     Tremors, gait abnormality  . Enalapril   . Flomax [Tamsulosin Hcl]   . Hydrochlorothiazide   . Metoprolol     VITALS:  Blood pressure (!) 110/51, pulse (!) 51, temperature 98.7 F (37.1 C), resp. rate 17, height 6' (1.829 m), weight 93.9 kg (207 lb), SpO2 98 %.  PHYSICAL EXAMINATION:   Physical Exam  GENERAL:  84 y.o.-year-old patient lying in the bed with no acute distress.  EYES: Pupils equal, round, reactive to light and accommodation.  HEENT: Head atraumatic, normocephalic. Oropharynx and nasopharynx clear.  NECK:  Supple, no jugular venous distention. No thyroid enlargement, no tenderness.  LUNGS: Normal breath sounds bilaterally, no wheezing, rales, rhonchi. No use of accessory muscles of respiration.  CARDIOVASCULAR: S1, S2 normal. No murmurs, rubs, or gallops.  ABDOMEN: Soft, nontender, nondistended. Bowel sounds present. No organomegaly or mass.  EXTREMITIES: No cyanosis, clubbing or edema b/l.    NEUROLOGIC: moves extremity spontaneously. Limited exam secondary to advanced dementia PSYCHIATRIC:  patient opens eyes on verbal commands but does not communicate much  SKIN: No obvious rash, lesion, or ulcer.   LABORATORY PANEL:  CBC Recent Labs  Lab 12/16/17 0455  WBC 10.7*  HGB 12.1*  HCT 37.0*  PLT 187    Chemistries  Recent Labs  Lab 12/16/17 0455  12/17/17 0740  NA 157*   < > 153*   K 3.5  --   --   CL 119*  --   --   CO2 32  --   --   GLUCOSE 113*  --   --   BUN 63*  --   --   CREATININE 2.45*  --   --   CALCIUM 8.4*  --   --    < > = values in this interval not displayed.   Cardiac Enzymes No results for input(s): TROPONINI in the last 168 hours. RADIOLOGY:  No results found. ASSESSMENT AND PLAN:  84 y m with a known history of advanced Alzheimer's dementia, osteoporosis, hyperlipidemia, depression, and kidney disease who presents from [facility] where he is a long-term resident for last 3 years came after a fall. Being admitted for severe hypernatremia  * Severe Hypernatremia - patient seems dehydrated. 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 Recommended to not use benzodiazepines due to risk of falls.   *Relative  Hypotension - stop all BP meds  D/w dter Malva Cogan. Case discussed with Care Management/Social Worker. Management plans discussed with  the patient, family and they are in agreement.  CODE STATUS: full  DVT Prophylaxis: heparin  TOTAL TIME TAKING CARE OF THIS PATIENT: 25 minutes.  >50% time spent on counselling and coordination of care  POSSIBLE D/C IN few  DAYS, DEPENDING ON CLINICAL CONDITION.  Note: This dictation was prepared with Dragon dictation along with smaller phrase technology. Any transcriptional errors that result from this process are unintentional.  Enedina Finner M.D on 12/17/2017 at 9:20 AM  Between 7am to 6pm - Pager - [phone]  After 6pm go to [email or web address] - Social research officer, government  [facility]  Office  [phone]  CC: Primary care physician; Keane Police, MDPatient ID: Jose Allen, male   DOB: 01-10-1926, 84 y.o.   MRN: [id number]

## 2017-12-18 LAB — RENAL FUNCTION PANEL
ALBUMIN: 2.7 g/dL — AB (ref 3.5–5.0)
ANION GAP: 9 (ref 5–15)
BUN: 22 mg/dL — AB (ref 6–20)
CALCIUM: 8 mg/dL — AB (ref 8.9–10.3)
CO2: 26 mmol/L (ref 22–32)
Chloride: 107 mmol/L (ref 101–111)
Creatinine, Ser: 1.5 mg/dL — ABNORMAL HIGH (ref 0.61–1.24)
GFR calc Af Amer: 46 mL/min — ABNORMAL LOW (ref >60)
GFR, EST NON AFRICAN AMERICAN: 40 mL/min — AB (ref >60)
GLUCOSE: 116 mg/dL — AB (ref 65–99)
PHOSPHORUS: 2.5 mg/dL (ref 2.5–4.6)
Potassium: 3.1 mmol/L — ABNORMAL LOW (ref 3.5–5.1)
SODIUM: 142 mmol/L (ref 135–145)

## 2017-12-18 LAB — GLUCOSE, CAPILLARY: GLUCOSE-CAPILLARY: 101 mg/dL — AB (ref 65–99)

## 2017-12-18 LAB — SODIUM: Sodium: 143 mmol/L (ref 135–145)

## 2017-12-18 MED ORDER — ISOSORBIDE MONONITRATE ER 30 MG PO TB24: 90.0000 mg | ORAL_TABLET | Freq: Every day | ORAL | 0 refills | Status: AC

## 2017-12-18 MED ORDER — CARVEDILOL 12.5 MG PO TABS
12.5000 mg | ORAL_TABLET | Freq: Two times a day (BID) | ORAL | Status: DC
  Filled 2017-12-18: qty 1

## 2017-12-18 MED ORDER — CARVEDILOL 12.5 MG PO TABS: 25.0000 mg | ORAL_TABLET | Freq: Two times a day (BID) | ORAL | 0 refills | Status: DC

## 2017-12-18 MED ORDER — ENSURE ENLIVE PO LIQD: 237.0000 mL | Freq: Two times a day (BID) | ORAL | 12 refills | Status: AC

## 2017-12-18 MED ORDER — POTASSIUM CHLORIDE 20 MEQ/15ML (10%) PO SOLN
40.0000 meq | Freq: Once | ORAL | Status: AC
Start: 2017-12-18 — End: 2017-12-18
  Administered 2017-12-18: 10:00:00 40 meq via ORAL
  Filled 2017-12-18: qty 30

## 2017-12-18 NOTE — Discharge Instructions (Signed)
-   Fall precaution 

## 2017-12-18 NOTE — Progress Notes (Signed)
[facility]  ROUNDING NOTE   Subjective:   More alert.   Na 142  Objective:  Vital signs in last 24 hours:  Temp:  [97.9 F (36.6 C)-98.6 F (37 C)] 98.1 F (36.7 C) (05/16 0519) Pulse Rate:  [53-76] 53 (05/16 0519) Resp:  [16-18] 16 (05/16 0519) BP: (112-143)/(54-63) 117/56 (05/16 0519) SpO2:  [98 %-100 %] 100 % (05/16 0519) Weight:  [90.7 kg (200 lb)] 90.7 kg (200 lb) (05/16 0519)  Weight change: -3.175 kg (-7 lb) Filed Weights   12/16/17 0504 12/17/17 0534 12/18/17 0519  Weight: 89.8 kg (198 lb) 93.9 kg (207 lb) 90.7 kg (200 lb)    Intake/Output: I/O last 3 completed shifts: In: 5497.1 [I.V.:5497.1] Out: 525 [Urine:525]   Intake/Output this shift:  No intake/output data recorded.  Physical Exam: General: Ill appearing  Head: Normocephalic, atraumatic. Dry oral mucosal membranes  Eyes: Anicteric, PERRL  Neck: Supple, trachea midline  Lungs:  Clear to auscultation  Heart: Regular rate and rhythm  Abdomen:  Soft, nontender,   Extremities: no peripheral edema.  Neurologic: Alert to self  Skin: No lesions       Basic Metabolic Panel: Recent Labs  Lab 12/15/17 0736  12/16/17 0455  12/17/17 0206 12/17/17 0740 12/17/17 1433 12/18/17 0143 12/18/17 0540  NA 160*   < > 157*   < > 152* 153* 150* 143 142  K 4.5  --  3.5  --   --   --   --   --  3.1*  CL 115*  --  119*  --   --   --   --   --  107  CO2 35*  --  32  --   --   --   --   --  26  GLUCOSE 126*  --  113*  --   --   --   --   --  116*  BUN 87*  --  63*  --   --   --   --   --  22*  CREATININE 3.12*  --  2.45*  --   --   --   --   --  1.50*  CALCIUM 8.7*  --  8.4*  --   --   --   --   --  8.0*  PHOS  --   --   --   --   --   --   --   --  2.5   < > = values in this interval not displayed.    Liver Function Tests: Recent Labs  Lab 12/18/17 0540  ALBUMIN 2.7*   No results for input(s): LIPASE, AMYLASE in the last 168 hours. No results for input(s): AMMONIA in the last 168  hours.  CBC: Recent Labs  Lab 12/15/17 0736 12/16/17 0455  WBC 9.6 10.7*  HGB 12.9* 12.1*  HCT 39.0* 37.0*  MCV 96.5 96.6  PLT 183 187    Cardiac Enzymes: No results for input(s): CKTOTAL, CKMB, CKMBINDEX, TROPONINI in the last 168 hours.  BNP: Invalid input(s): POCBNP  CBG: Recent Labs  Lab 12/16/17 0745 12/16/17 0839 12/17/17 0754 12/18/17 0742  GLUCAP 65 98 124* 101*    Microbiology: Results for orders placed or performed during the hospital encounter of 12/15/17  MRSA PCR Screening     Status: None   Collection Time: 12/15/17 12:01 PM  Result Value Ref Range Status   MRSA by PCR NEGATIVE NEGATIVE Final    Comment:  The GeneXpert MRSA Assay (FDA approved for NASAL specimens only), is one component of a comprehensive MRSA colonization surveillance program. It is not intended to diagnose MRSA infection nor to guide or monitor treatment for MRSA infections. Performed at [facility], [address] [place], [place] [address]     Coagulation Studies: No results for input(s): LABPROT, INR in the last 72 hours.  Urinalysis: No results for input(s): COLORURINE, LABSPEC, PHURINE, GLUCOSEU, HGBUR, BILIRUBINUR, KETONESUR, PROTEINUR, UROBILINOGEN, NITRITE, LEUKOCYTESUR in the last 72 hours.  Invalid input(s): APPERANCEUR    Imaging: No results found.   Medications:    . acetaminophen  1,000 mg Oral BID  . aspirin EC  81 mg Oral Daily  . cholecalciferol  1,000 Units Oral Daily  . docusate  100 mg Oral BID  . feeding supplement (ENSURE ENLIVE)  237 mL Oral BID BM  . finasteride  5 mg Oral Daily  . heparin  5,000 Units Subcutaneous Q8H  . lactulose  20 g Oral QODAY  . mouth rinse  15 mL Mouth Rinse BID  . polyethylene glycol  17 g Oral Daily  . traZODone  25 mg Oral QHS   acetaminophen **OR** acetaminophen, bisacodyl, ondansetron **OR** ondansetron (ZOFRAN) IV  Assessment/ Plan:  [name] is a 84 y.o. black male with  dementia, BPH, hypertension, coronary artery disease , osteoporosis, depression  1. Hypernatremia: free water deficit improved - Encourage PO intake  2. Acute renal failure on Chronic kidney disease stage III with proteinuria: baseline creatinine 1.32, GFR of 47.  Chronic kidney disease secondary to long standing hypertension  Acute renal failure secondary to prerenal azotemia  3. Hypertension with peripheral edema: blood pressure at goal.  - amlodipine, carvedilol, isosorbide mononitrate - Holding torsemide - Not currently taking an ACE-I/ARB (history of hyperkalemia)  4. Urinary tract infection - completed course of nitrofuratoin.    LOS: 3 Jose Allen 5/16/20191:13 PM

## 2017-12-18 NOTE — Progress Notes (Signed)
Report called to facility, Morrie Sheldon, LPN. EMS called for transport

## 2017-12-18 NOTE — Care Management Important Message (Signed)
Important Message  Patient Details  Name: Jose Allen MRN: [id number] Date of Birth: 08-Feb-1926   Medicare Important Message Given:  Yes    Olegario Messier A [name] 12/18/2017, 10:46 AM

## 2017-12-18 NOTE — Discharge Summary (Addendum)
[facility] [place] at [facility]   PATIENT NAME: Jose Allen    MR#:  [id number]  DATE OF BIRTH:  12-27-1925  DATE OF ADMISSION:  12/15/2017 ADMITTING PHYSICIAN: Delfino Lovett, MD  DATE OF DISCHARGE: 12/18/2017  PRIMARY CARE PHYSICIAN: Keane Police, MD    ADMISSION DIAGNOSIS:  Hypernatremia [E87.0] Encephalopathy acute [G93.40] Injury of head, initial encounter [S09.90XA] Fall, initial encounter [W19.XXXA] Laceration of forehead, initial encounter [S01.81XA] Acute kidney injury superimposed on chronic kidney disease (HCC) [N17.9, N18.9]  DISCHARGE DIAGNOSIS:  Acute Hypernatremia due to Dehydration 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 Age-related osteoporosis without current pathological fracture   . Benign prostatic hyperplasia without lower urinary tract symptoms   . Constipation   . Dysphagia, oral phase   . Gastro-esophageal reflux disease without esophagitis   . Hyperlipidemia   . Insomnia, unspecified   . Major depressive disorder, single episode, unspecified   . Muscle weakness (generalized)   . Pain, unspecified   . Renal disorder   . Vitamin D deficiency, unspecified     HOSPITAL COURSE:   84 y m witha known history ofadvanced Alzheimer's dementia, osteoporosis, hyperlipidemia, depression, and kidney disease who presents from [facility] where heis a long-term resident for last 3 years cameafter a fall. Being admitted for severe hypernatremia  * Severe Hypernatremia - patient seems dehydrated. 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 Recommended to not use benzodiazepines due to risk of falls.   *Relative  Hypotension -will resume clonidine prn. D/c rest of the BP meds. -decreased imdur to 30 mg daily---consider d/cing it if BP stays on the lower side. -avoiding BB due to HR in the 50's  * mild hypokalemia -KCl 40 Meq today  Pt will return back to [facility] (He is a long term resident). Dter had question about switching NH but was explained by CSW that this needs to be pursued via Warehouse manager.  D/[facility] back to [facility] today.  Pt is a HIGH FALL RISK.   CONSULTS OBTAINED:  Treatment Team:  Lamont Dowdy, MD  DRUG ALLERGIES:   Allergies  Allergen Reactions  . Terazosin     Other reaction(s): Other (See Comments) NEAR CARDIAC ARREST  . Depakote [Divalproex Sodium]     Tremors, gait abnormality  . Enalapril   . Flomax [Tamsulosin Hcl]   . Hydrochlorothiazide   . Metoprolol     DISCHARGE MEDICATIONS:   Allergies as of 12/18/2017      Reactions   Terazosin    Other reaction(s): Other (See Comments) NEAR CARDIAC ARREST   Depakote [divalproex Sodium]    Tremors, gait abnormality   Enalapril    Flomax [tamsulosin Hcl]    Hydrochlorothiazide    Metoprolol       Medication List    STOP taking these medications   amLODipine 5 MG tablet Commonly known as:  NORVASC   carvedilol 25 MG tablet Commonly known as:  COREG   escitalopram 10 MG tablet Commonly known as:  LEXAPRO   nitrofurantoin 100 MG capsule Commonly known as:  MACRODANTIN     TAKE these medications   acetaminophen 325 MG tablet Commonly known as:  TYLENOL Take 650 mg by mouth every 8 (eight) hours as needed for mild pain, moderate pain or fever.   acetaminophen 500 MG tablet Commonly known as:  TYLENOL Take 1,000 mg by mouth 2 (two) times daily.   aspirin EC 81 MG tablet Take 81 mg by mouth daily.    cholecalciferol 1000 units tablet Commonly known as:  VITAMIN D Take 1,000 Units by mouth daily.   cloNIDine 0.1 MG tablet Commonly known as:  CATAPRES Take 0.1 mg by mouth daily as needed. For bp >170.   feeding supplement (ENSURE ENLIVE) Liqd Take 237 mLs by mouth 2 (two) times daily between meals.   finasteride 5 MG tablet Commonly known as:  PROSCAR Take 5 mg by mouth daily.   isosorbide mononitrate 30 MG 24 hr tablet Commonly known as:  IMDUR Take 3 tablets (90 mg total) by mouth daily. What changed:  medication strength   lactulose 10 GM/15ML solution Commonly known as:  CHRONULAC Take 20 g by mouth every other day.   mineral oil-hydrophilic petrolatum ointment Apply 1 application topically at bedtime. Apply to bilateral lower legs and feet   polyethylene glycol packet Commonly known as:  MIRALAX / GLYCOLAX Take 17 g by mouth daily.   solifenacin 10 MG tablet Commonly known as:  VESICARE Take 1 tablet (10 mg total) by mouth daily.   traZODone 50 MG tablet Commonly known as:  DESYREL Take 25 mg by mouth at bedtime.       If you experience worsening of your admission symptoms, develop shortness of breath, life threatening emergency, suicidal or homicidal thoughts you must seek medical attention immediately by calling 911 or calling your MD immediately  if symptoms less severe.  You Must read complete instructions/literature along with all the possible adverse reactions/side effects for all the Medicines you take and that have been prescribed to you. Take any new Medicines after you have completely understood and accept all the possible adverse reactions/side effects.   Please note  You were cared for by a hospitalist during your hospital stay. If you have any questions about your discharge medications or the care you received while you were in the hospital after you are discharged, you can call the unit and asked to speak with the hospitalist on call if the  hospitalist that took care of you is not available. Once you are discharged, your primary care physician will handle any further medical issues. Please note that NO REFILLS for any discharge medications will be authorized once you are discharged, as it is imperative that you return to your primary care physician (or establish a relationship with a primary care physician if you do not have one) for your aftercare needs so that they can reassess your need for medications and monitor your lab values. Today   SUBJECTIVE   Awake, responds with some answers. At baseline has dementia.  VITAL SIGNS:  Blood pressure (!) 117/56, pulse (!) 53, temperature 98.1 F (36.7 C), temperature source Axillary, resp. rate 16, height 6' (1.829 m), weight 90.7 kg (200 lb), SpO2 100 %.  I/O:    Intake/Output Summary (Last 24 hours) at 12/18/2017 0822 Last data filed at 12/18/2017 0417 Gross per 24 hour  Intake 2649.17 ml  Output -  Net 2649.17 ml    PHYSICAL EXAMINATION:  GENERAL:  84 y.o.-year-old patient lying in the bed with no acute distress.  EYES: Pupils equal, round, reactive to light and accommodation. No scleral icterus. Extraocular muscles intact.  HEENT: Head atraumatic, normocephalic. Oropharynx and nasopharynx clear.  NECK:  Supple, no jugular venous distention. No thyroid enlargement, no tenderness.  LUNGS: Normal breath sounds bilaterally, no wheezing, rales,rhonchi or crepitation. No use of accessory muscles of respiration.  CARDIOVASCULAR: S1, S2 normal. No murmurs, rubs, or gallops.  ABDOMEN: Soft, non-tender, non-distended. Bowel sounds present. No organomegaly or mass.  EXTREMITIES: No pedal edema, cyanosis, or clubbing.  NEUROLOGIC: moves all extremities well. Limited exam due to dementia . Gait not checked.  PSYCHIATRIC:  patient is alert and awake  SKIN: No obvious rash, lesion, or ulcer.   DATA REVIEW:   CBC  Recent Labs  Lab 12/16/17 0455  WBC 10.7*  HGB 12.1*  HCT 37.0*   PLT 187    Chemistries  Recent Labs  Lab 12/18/17 0540  NA 142  K 3.1*  CL 107  CO2 26  GLUCOSE 116*  BUN 22*  CREATININE 1.50*  CALCIUM 8.0*    Microbiology Results   Recent Results (from the past 240 hour(s))  MRSA PCR Screening     Status: None   Collection Time: 12/15/17 12:01 PM  Result Value Ref Range Status   MRSA by PCR NEGATIVE NEGATIVE Final    Comment:        The GeneXpert MRSA Assay (FDA approved for NASAL specimens only), is one component of a comprehensive MRSA colonization surveillance program. It is not intended to diagnose MRSA infection nor to guide or monitor treatment for MRSA infections. Performed at [facility], [address] [place], [place] [address]     RADIOLOGY:  No results found.   Management plans discussed with the patient, family and they are in agreement.  CODE STATUS:     Code Status Orders  (From admission, onward)        Start     Ordered   12/15/17 1108  Full code  Continuous     12/15/17 1107    Code Status History    This patient has a current code status but no historical code status.      TOTAL TIME TAKING CARE OF THIS PATIENT: 40 minutes.    Enedina Finner M.D on 12/18/2017 at 8:22 AM  Between 7am to 6pm - Pager - [phone] After 6pm go to [email or web address] - Social research officer, government  [facility]  Office  [phone]  CC: Primary care physician; Keane Police, MD

## 2017-12-18 NOTE — Clinical Social Work Note (Signed)
Patient is medically ready for discharge today. CSW notified patient's daughter Greyden Besecker [phone] of discharge back to [facility] today. Patient's daughter asked about patient going to a different facility and CSW explained that she would need to speak with the social worker at [facility] to do a transfer. CSW also notified Gavin Pound at [facility] about patient returning to them today. CSW sent discharge paperwork to [facility] via [employer]. Patient will be transported by EMS. RN to call report and call for transport. CSW signing off. Please re consult if further needs arise.   Ruthe Mannan MSW, [address] [phone]

## 2017-12-25 ENCOUNTER — Encounter: Payer: Self-pay | Admitting: Urology

## 2017-12-25 ENCOUNTER — Ambulatory Visit (INDEPENDENT_AMBULATORY_CARE_PROVIDER_SITE_OTHER): Payer: Medicare Other | Admitting: Urology

## 2017-12-25 VITALS — BP 108/62 | HR 78 | Temp 97.6°F | Ht 66.0 in | Wt 181.5 lb

## 2017-12-25 DIAGNOSIS — R350 Frequency of micturition: Secondary | ICD-10-CM

## 2017-12-25 DIAGNOSIS — N138 Other obstructive and reflux uropathy: Secondary | ICD-10-CM

## 2017-12-25 DIAGNOSIS — N401 Enlarged prostate with lower urinary tract symptoms: Secondary | ICD-10-CM

## 2017-12-25 LAB — BLADDER SCAN AMB NON-IMAGING

## 2017-12-25 NOTE — Progress Notes (Signed)
12/25/2017 12:24 PM   Jose Allen [birth year] [id number]  Referring provider: Keane Police, MD [phone] [place], [place] [address]  Chief Complaint  Patient presents with  . Follow-up    HPI: 84 year old male with dementia previously seen by Dr. Mena Allen for overactive bladder and urinary incontinence.  He has been on alpha blockers and finasteride.  He was tried on time voiding and Myrbetriq which was not effective.  He was started on Vesicare after failing Myrbetriq and noted significant improvement in his voiding pattern.  Interestingly his confusion and behavior was also better.  His daughter states he was taken off Myrbetriq at his SNF and had recurrent symptoms.  These resolved once he was restarted.  He recently finished an antibiotic for a UTI 2 weeks ago.  He remains on finasteride.   PMH: Past Medical History:  Diagnosis Date  . Age-related osteoporosis without current pathological fracture   . Benign prostatic hyperplasia without lower urinary tract symptoms   . Constipation   . Dysphagia, oral phase   . Gastro-esophageal reflux disease without esophagitis   . Hyperlipidemia   . Insomnia, unspecified   . Major depressive disorder, single episode, unspecified   . Muscle weakness (generalized)   . Pain, unspecified   . Renal disorder   . Vitamin D deficiency, unspecified     Surgical History: History reviewed. No pertinent surgical history.  Home Medications:  Allergies as of 12/25/2017      Reactions   Terazosin    Other reaction(s): Other (See Comments) NEAR CARDIAC ARREST   Depakote [divalproex Sodium]    Tremors, gait abnormality   Enalapril    Flomax [tamsulosin Hcl]    Hydrochlorothiazide    Metoprolol       Medication List        Accurate as of 12/25/17 12:24 PM. Always use your most recent med list.          acetaminophen 325 MG tablet Commonly known as:  TYLENOL Take 650 mg by mouth every 8 (eight) hours as  needed for mild pain, moderate pain or fever.   acetaminophen 500 MG tablet Commonly known as:  TYLENOL Take 1,000 mg by mouth 2 (two) times daily.   amLODipine 5 MG tablet Commonly known as:  NORVASC Take by mouth.   aspirin EC 81 MG tablet Take 81 mg by mouth daily.   carvedilol 25 MG tablet Commonly known as:  COREG Take by mouth.   cholecalciferol 1000 units tablet Commonly known as:  VITAMIN D Take 1,000 Units by mouth daily.   cloNIDine 0.1 MG tablet Commonly known as:  CATAPRES Take 0.1 mg by mouth daily as needed. For bp >170.   escitalopram 10 MG tablet Commonly known as:  LEXAPRO Take by mouth.   feeding supplement (ENSURE ENLIVE) Liqd Take 237 mLs by mouth 2 (two) times daily between meals.   finasteride 5 MG tablet Commonly known as:  PROSCAR Take 5 mg by mouth daily.   isosorbide mononitrate 30 MG 24 hr tablet Commonly known as:  IMDUR Take 3 tablets (90 mg total) by mouth daily.   lactulose 10 GM/15ML solution Commonly known as:  CHRONULAC Take 20 g by mouth every other day.   mineral oil-hydrophilic petrolatum ointment Apply 1 application topically at bedtime. Apply to bilateral lower legs and feet   MYRBETRIQ 50 MG Tb24 tablet Generic drug:  mirabegron ER Take by mouth.   polyethylene glycol packet Commonly known as:  MIRALAX / GLYCOLAX Take 17  g by mouth daily.   solifenacin 10 MG tablet Commonly known as:  VESICARE Take 1 tablet (10 mg total) by mouth daily.   torsemide 20 MG tablet Commonly known as:  DEMADEX Take by mouth.   traZODone 50 MG tablet Commonly known as:  DESYREL Take 25 mg by mouth at bedtime.       Allergies:  Allergies  Allergen Reactions  . Terazosin     Other reaction(s): Other (See Comments) NEAR CARDIAC ARREST  . Depakote [Divalproex Sodium]     Tremors, gait abnormality  . Enalapril   . Flomax [Tamsulosin Hcl]   . Hydrochlorothiazide   . Metoprolol     Family History: History reviewed. No  pertinent family history.  Social History:  reports that he has quit smoking. His smoking use included pipe. He has never used smokeless tobacco. He reports that he drank alcohol. He reports that he does not use drugs.  ROS: UROLOGY Frequent Urination?: Yes Hard to postpone urination?: No Burning/pain with urination?: No Get up at night to urinate?: No Leakage of urine?: No Urine stream starts and stops?: No Trouble starting stream?: No Do you have to strain to urinate?: No Blood in urine?: No Urinary tract infection?: No Sexually transmitted disease?: No Injury to kidneys or bladder?: No Painful intercourse?: No Weak stream?: No Erection problems?: No Penile pain?: No  Gastrointestinal Nausea?: No Vomiting?: No Indigestion/heartburn?: No Diarrhea?: No Constipation?: No  Constitutional Fever: No Night sweats?: No Weight loss?: No Fatigue?: No  Skin Skin rash/lesions?: No Itching?: No  Eyes Blurred vision?: No Double vision?: No  Ears/Nose/Throat Sore throat?: No Sinus problems?: No  Hematologic/Lymphatic Swollen glands?: No Easy bruising?: No  Cardiovascular Leg swelling?: No Chest pain?: No  Respiratory Cough?: No Shortness of breath?: No  Endocrine Excessive thirst?: No  Musculoskeletal Back pain?: No Joint pain?: No  Neurological Headaches?: No Dizziness?: No  Psychologic Depression?: No Anxiety?: No  Physical Exam: BP 108/62 (BP Location: Left Arm, Patient Position: Sitting, Cuff Size: Normal)   Pulse 78   Temp 97.6 F (36.4 C) (Oral)   Ht  (1.676 m)   Wt 181 lb 8 oz (82.3 kg)   SpO2 99%   BMI 29.29 kg/m   Constitutional:  Alert, No acute distress. HEENT: [place] AT, moist mucus membranes.  Trachea midline, no masses. Cardiovascular: No clubbing, cyanosis, or edema. Respiratory: Normal respiratory effort, no increased work of breathing. GI: Abdomen is soft, nontender, nondistended, no abdominal masses GU: No CVA  tenderness Skin: No rashes, bruises or suspicious lesions.    Laboratory Data: Lab Results  Component Value Date   WBC 10.7 (H) 12/16/2017   HGB 12.1 (L) 12/16/2017   HCT 37.0 (L) 12/16/2017   MCV 96.6 12/16/2017   PLT 187 12/16/2017    Lab Results  Component Value Date   CREATININE 1.50 (H) 12/18/2017     Assessment & Plan:   84 year old male with dementia, BPH and overactive bladder currently doing well on Vesicare.  He will continue this medication.  A urinalysis was not attempted today.  His estimated bladder volume by bladder scan was 170 mL.  He has a follow-up scheduled in October 2019.  Call earlier as needed for any significant changes.   Riki Altes, MD  [facility] [address], [address] [place], [place] [address] [phone]

## 2017-12-30 ENCOUNTER — Encounter: Payer: Self-pay | Admitting: Urology

## 2018-02-11 ENCOUNTER — Emergency Department
Admission: EM | Admit: 2018-02-11 | Discharge: 2018-02-11 | Disposition: A | Discharge status: Home / Self Care | Payer: Medicare Other | Attending: Emergency Medicine | Admitting: Emergency Medicine

## 2018-02-11 ENCOUNTER — Emergency Department: Payer: Medicare Other

## 2018-02-11 DIAGNOSIS — F329 Major depressive disorder, single episode, unspecified: Secondary | ICD-10-CM | POA: Insufficient documentation

## 2018-02-11 DIAGNOSIS — Z79899 Other long term (current) drug therapy: Secondary | ICD-10-CM | POA: Diagnosis not present

## 2018-02-11 DIAGNOSIS — W010XXA Fall on same level from slipping, tripping and stumbling without subsequent striking against object, initial encounter: Secondary | ICD-10-CM | POA: Insufficient documentation

## 2018-02-11 DIAGNOSIS — W19XXXA Unspecified fall, initial encounter: Secondary | ICD-10-CM

## 2018-02-11 DIAGNOSIS — Z7982 Long term (current) use of aspirin: Secondary | ICD-10-CM | POA: Insufficient documentation

## 2018-02-11 DIAGNOSIS — R251 Tremor, unspecified: Secondary | ICD-10-CM | POA: Diagnosis present

## 2018-02-11 DIAGNOSIS — Z87891 Personal history of nicotine dependence: Secondary | ICD-10-CM | POA: Diagnosis not present

## 2018-02-11 DIAGNOSIS — F0390 Unspecified dementia without behavioral disturbance: Secondary | ICD-10-CM | POA: Insufficient documentation

## 2018-02-11 DIAGNOSIS — N390 Urinary tract infection, site not specified: Secondary | ICD-10-CM | POA: Diagnosis not present

## 2018-02-11 LAB — CBC WITH DIFFERENTIAL/PLATELET
BASOS PCT: 0 %
Basophils Absolute: 0 10*3/uL (ref 0–0.1)
EOS ABS: 0.3 10*3/uL (ref 0–0.7)
Eosinophils Relative: 3 %
HEMATOCRIT: 35.3 % — AB (ref 40.0–52.0)
HEMOGLOBIN: 11.6 g/dL — AB (ref 13.0–18.0)
Lymphocytes Relative: 19 %
Lymphs Abs: 1.6 10*3/uL (ref 1.0–3.6)
MCH: 31.1 pg (ref 26.0–34.0)
MCHC: 33 g/dL (ref 32.0–36.0)
MCV: 94.3 fL (ref 80.0–100.0)
MONO ABS: 0.6 10*3/uL (ref 0.2–1.0)
MONOS PCT: 7 %
NEUTROS ABS: 5.8 10*3/uL (ref 1.4–6.5)
Neutrophils Relative %: 71 %
Platelets: 223 10*3/uL (ref 150–440)
RBC: 3.74 MIL/uL — ABNORMAL LOW (ref 4.40–5.90)
RDW: 14.6 % — ABNORMAL HIGH (ref 11.5–14.5)
WBC: 8.3 10*3/uL (ref 3.8–10.6)

## 2018-02-11 LAB — URINALYSIS, COMPLETE (UACMP) WITH MICROSCOPIC
BACTERIA UA: NONE SEEN
Bilirubin Urine: NEGATIVE
GLUCOSE, UA: NEGATIVE mg/dL
KETONES UR: NEGATIVE mg/dL
NITRITE: NEGATIVE
PH: 6 (ref 5.0–8.0)
Protein, ur: NEGATIVE mg/dL
SPECIFIC GRAVITY, URINE: 1.016 (ref 1.005–1.030)
WBC, UA: >50 WBC/hpf — ABNORMAL HIGH (ref 0–5)

## 2018-02-11 LAB — COMPREHENSIVE METABOLIC PANEL
ALBUMIN: 3.4 g/dL — AB (ref 3.5–5.0)
ALK PHOS: 74 U/L (ref 38–126)
ALT: 14 U/L (ref 0–44)
ANION GAP: 11 (ref 5–15)
AST: 24 U/L (ref 15–41)
BILIRUBIN TOTAL: 0.6 mg/dL (ref 0.3–1.2)
BUN: 20 mg/dL (ref 8–23)
CALCIUM: 8.8 mg/dL — AB (ref 8.9–10.3)
CO2: 27 mmol/L (ref 22–32)
Chloride: 102 mmol/L (ref 98–111)
Creatinine, Ser: 1.17 mg/dL (ref 0.61–1.24)
GFR calc non Af Amer: 53 mL/min — ABNORMAL LOW (ref >60)
GLUCOSE: 112 mg/dL — AB (ref 70–99)
POTASSIUM: 3.7 mmol/L (ref 3.5–5.1)
Sodium: 140 mmol/L (ref 135–145)
TOTAL PROTEIN: 7.9 g/dL (ref 6.5–8.1)

## 2018-02-11 LAB — TROPONIN I

## 2018-02-11 LAB — TSH: TSH: 1.534 u[IU]/mL (ref 0.350–4.500)

## 2018-02-11 MED ORDER — CEPHALEXIN 500 MG PO CAPS: 500.0000 mg | ORAL_CAPSULE | Freq: Three times a day (TID) | ORAL | 0 refills | Status: AC

## 2018-02-11 MED ORDER — SODIUM CHLORIDE 0.9 % IV SOLN
2.0000 g | Freq: Once | INTRAVENOUS | Status: AC
  Administered 2018-02-11: 2 g via INTRAVENOUS
  Filled 2018-02-11: qty 20

## 2018-02-11 NOTE — ED Notes (Signed)
Family updated about UTI and that pt will be d/c back to facility. EMS will be contacted [facility].

## 2018-02-11 NOTE — ED Notes (Signed)
Spoke with pt daughter over the phone regarding pt plan of care and d/c back to facility. Awaiting [facility] pick up for transfer back to nursing home.

## 2018-02-11 NOTE — ED Notes (Signed)
Straight cath UA obtained with RNx2. Pt tolerated well. VSS.

## 2018-02-11 NOTE — ED Notes (Signed)
[facility]  CALLED  FOR  TRANSPORT  BACK  TO  [facility]

## 2018-02-11 NOTE — ED Notes (Signed)
Report called to Kate [facility]ableHannah Watson at [facility] and they are aware pt is on the way back to facility.

## 2018-02-11 NOTE — ED Triage Notes (Signed)
Unwitnessed fall while at SNF, found outside of his room by staff. Dementia at baseline. Unknown if on bloodthinners. Pt denies complaints. Alert to self only.

## 2018-02-11 NOTE — ED Provider Notes (Addendum)
[facility] Emergency Department Provider Note ____________________________________________   First MD Initiated Contact with Patient 02/11/18 1510     (approximate)  I have reviewed the triage vital signs and the nursing notes.   HISTORY  Chief Complaint Altered Mental Status  HPI Jose Allen is a 84 y.o. male with a history of dementia with behavioral disturbance recently started on Seroquel and lamotrigine 2 weeks ago who is presenting to the emergency department after being found down in the hallway by his skilled nursing facility staff.  Per the patient's daughter, the patient was found to be shaking "just for a moment."  Patient does not have a history of seizure but the daughter does report the patient does have some shaking episodes intermittently.  Daughter reports patient has baseline mental status at this time.  Says that she is also had multiple falls.  No history of seizures.  Past Medical History:  Diagnosis Date  . Age-related osteoporosis without current pathological fracture   . Benign prostatic hyperplasia without lower urinary tract symptoms   . Constipation   . Dysphagia, oral phase   . Gastro-esophageal reflux disease without esophagitis   . Hyperlipidemia   . Insomnia, unspecified   . Major depressive disorder, single episode, unspecified   . Muscle weakness (generalized)   . Pain, unspecified   . Renal disorder   . Vitamin D deficiency, unspecified     Patient Active Problem List   Diagnosis Date Noted  . Hyponatremia 12/15/2017    No past surgical history on file.  Prior to Admission medications   Medication Sig Start Date End Date Taking? Authorizing Provider  acetaminophen (TYLENOL) 500 MG tablet Take 1,000 mg by mouth 2 (two) times daily.   Yes [provider]  aspirin EC 81 MG tablet Take 81 mg by mouth daily.   Yes [provider]  cloNIDine (CATAPRES) 0.1 MG tablet Take 0.1 mg by mouth daily as  needed. For bp >170.   Yes [provider]  finasteride (PROSCAR) 5 MG tablet Take 5 mg by mouth daily.   Yes [provider]  isosorbide mononitrate (IMDUR) 30 MG 24 hr tablet Take 3 tablets (90 mg total) by mouth daily. 12/18/17  Yes Enedina Finner, MD  lactulose (CHRONULAC) 10 GM/15ML solution Take 20 g by mouth daily as needed for mild constipation.    Yes [provider]  lamoTRIgine (LAMICTAL) 25 MG tablet Take 25 mg by mouth 2 (two) times daily.   Yes [provider]  mineral oil-hydrophilic petrolatum (AQUAPHOR) ointment Apply 1 application topically at bedtime. Apply to bilateral lower legs and feet   Yes [provider]  polyethylene glycol (MIRALAX / GLYCOLAX) packet Take 17 g by mouth daily.   Yes [provider]  QUEtiapine (SEROQUEL) 25 MG tablet Take 25 mg by mouth at bedtime.   Yes [provider]  solifenacin (VESICARE) 10 MG tablet Take 1 tablet (10 mg total) by mouth daily. 04/15/17  Yes Jerilee Field, MD  traZODone (DESYREL) 50 MG tablet Take 25 mg by mouth at bedtime.   Yes [provider]  feeding supplement, ENSURE ENLIVE, (ENSURE ENLIVE) LIQD Take 237 mLs by mouth 2 (two) times daily between meals. Patient not taking: Reported on 02/11/2018 12/18/17   Enedina Finner, MD    Allergies Terazosin; Depakote [divalproex sodium]; Enalapril; Flomax [tamsulosin hcl]; Hydrochlorothiazide; and Metoprolol  No family history on file.  Social History Social History   Tobacco Use  . Smoking status:  Former Smoker    Types: Pipe  . Smokeless tobacco: Never Used  Substance Use Topics  . Alcohol use: Not Currently  . Drug use: Never    Review of Systems  Level 5 caveat secondary to dementia.   ____________________________________________   PHYSICAL EXAM:  VITAL SIGNS: ED Triage Vitals  Enc Vitals Group     BP 02/11/18 1519 (!) 158/142     Pulse Rate 02/11/18 1519 83     Resp 02/11/18 1519 20     Temp  02/11/18 1519 98.7 F (37.1 C)     Temp Source 02/11/18 1519 Oral     SpO2 02/11/18 1519 97 %     Weight 02/11/18 1525 180 lb (81.6 kg)     Height 02/11/18 1525 5\' 6"  (1.676 m)     Head Circumference --      Peak Flow --      Pain Score 02/11/18 1520 0     Pain Loc --      Pain Edu? --      Excl. in GC? --     Constitutional: Alert and oriented to self only.  in no acute distress.  Mild resting tremor to the right upper extremity. Eyes: Conjunctivae are normal.  Head: Atraumatic. Nose: No congestion/rhinnorhea. Mouth/Throat: Mucous membranes are moist.  Neck: No stridor.   Cardiovascular: Normal rate, regular rhythm. Grossly normal heart sounds.   Respiratory: Normal respiratory effort.  No retractions. Lungs CTAB. Gastrointestinal: Soft and nontender. No distention. No CVA tenderness. Musculoskeletal: Moderate bilateral lower extremity edema. Neurologic:  Normal speech and language. No gross focal neurologic deficits are appreciated.  Patient grossly moving all 4 extremities.  No facial asymmetry. Skin:  Skin is warm, dry and intact. No rash noted. Psychiatric: Mood and affect are normal.   ____________________________________________   LABS (all labs ordered are listed, but only abnormal results are displayed)  Labs Reviewed  CBC WITH DIFFERENTIAL/PLATELET - Abnormal; Notable for the following components:      Result Value   RBC 3.74 (*)    Hemoglobin 11.6 (*)    HCT 35.3 (*)    RDW 14.6 (*)    All other components within normal limits  COMPREHENSIVE METABOLIC PANEL - Abnormal; Notable for the following components:   Glucose, Bld 112 (*)    Calcium 8.8 (*)    Albumin 3.4 (*)    GFR calc non Af Amer 53 (*)    All other components within normal limits  URINALYSIS, COMPLETE (UACMP) WITH MICROSCOPIC - Abnormal; Notable for the following components:   Color, Urine YELLOW (*)    APPearance HAZY (*)    Hgb urine dipstick SMALL (*)    Leukocytes, UA MODERATE (*)    WBC,  UA >50 (*)    All other components within normal limits  URINE CULTURE  TROPONIN I  TSH   ____________________________________________  EKG  ED ECG REPORT I, Arelia Longestavid M Lillymae Duet, the attending physician, personally viewed and interpreted this ECG.   Date: 02/11/2018  EKG Time: 1516  Rate: 82  Rhythm: normal sinus rhythm  Axis: Normal  Intervals:none  ST&T Change: No ST segment elevation or depression.  No abnormal T wave inversion.  Static in the baseline of the EKG.  ____________________________________________  RADIOLOGY  No acute finding on CT of the head, C-spine.  No acute finding on the chest x-ray nor the pelvic x-ray. ____________________________________________   PROCEDURES  Procedure(s) performed:   Procedures  Critical Care performed:   ____________________________________________  INITIAL IMPRESSION / ASSESSMENT AND PLAN / ED COURSE  Pertinent labs & imaging results that were available during my care of the patient were reviewed by me and considered in my medical decision making (see chart for details).  Differential diagnosis includes, but is not limited to, alcohol, illicit or prescription medications, or other toxic ingestion; intracranial pathology such as stroke or intracerebral hemorrhage; fever or infectious causes including sepsis; hypoxemia and/or hypercarbia; uremia; trauma; endocrine related disorders such as diabetes, hypoglycemia, and thyroid-related diseases; hypertensive encephalopathy; etc. As part of my medical decision making, I reviewed the following data within the electronic MEDICAL RECORD NUMBER Notes from prior ED visits  ----------------------------------------- 5:34 PM on 02/11/2018 -----------------------------------------  Patient found to have UTI.  Patient remains at baseline mental status per family.  High blood pressure but patient has not taken his evening medications.  We will give him ceftriaxone in the emergency department  he will be discharged with Keflex.  Family concerned about the blood pressure.  However, we discussed with the patient to receive his evening meds as soon as he is discharged to his skilled nursing facility.  Family is understanding of this plan and willing to comply.  Unclear if patient had seizure activity versus myoclonic jerking.  Patient has a follow-up with his neurologist this coming Friday.  No obvious injury from the fall.      ____________________________________________   FINAL CLINICAL IMPRESSION(S) / ED DIAGNOSES  UTI.  Fall  NEW MEDICATIONS STARTED DURING THIS VISIT:  New Prescriptions   No medications on file     Note:  This document was prepared using Dragon voice recognition software and may include unintentional dictation errors.     Myrna Blazer, MD 02/11/18 1735    Myrna Blazer, MD 02/11/18 [phone]

## 2018-02-14 LAB — URINE CULTURE: Culture: >=100000 — AB

## 2018-03-20 ENCOUNTER — Emergency Department: Payer: Medicare Other

## 2018-03-20 ENCOUNTER — Other Ambulatory Visit: Payer: Self-pay

## 2018-03-20 ENCOUNTER — Emergency Department
Admission: EM | Admit: 2018-03-20 | Discharge: 2018-03-20 | Disposition: A | Discharge status: Other Acute Inpatient Hospital | Payer: Medicare Other | Attending: Emergency Medicine | Admitting: Emergency Medicine

## 2018-03-20 ENCOUNTER — Encounter: Payer: Self-pay | Admitting: Emergency Medicine

## 2018-03-20 ENCOUNTER — Ambulatory Visit (HOSPITAL_COMMUNITY)
Admission: AD | Admit: 2018-03-20 | Discharge: 2018-03-20 | Disposition: A | Discharge status: Home / Self Care | Payer: Medicare Other | Source: Other Acute Inpatient Hospital | Attending: Neurosurgery | Admitting: Neurosurgery

## 2018-03-20 DIAGNOSIS — Z79899 Other long term (current) drug therapy: Secondary | ICD-10-CM | POA: Diagnosis not present

## 2018-03-20 DIAGNOSIS — Y92129 Unspecified place in nursing home as the place of occurrence of the external cause: Secondary | ICD-10-CM | POA: Insufficient documentation

## 2018-03-20 DIAGNOSIS — E785 Hyperlipidemia, unspecified: Secondary | ICD-10-CM | POA: Diagnosis not present

## 2018-03-20 DIAGNOSIS — E871 Hypo-osmolality and hyponatremia: Secondary | ICD-10-CM | POA: Insufficient documentation

## 2018-03-20 DIAGNOSIS — Y939 Activity, unspecified: Secondary | ICD-10-CM | POA: Diagnosis not present

## 2018-03-20 DIAGNOSIS — S066X0A Traumatic subarachnoid hemorrhage without loss of consciousness, initial encounter: Secondary | ICD-10-CM

## 2018-03-20 DIAGNOSIS — N400 Enlarged prostate without lower urinary tract symptoms: Secondary | ICD-10-CM | POA: Diagnosis not present

## 2018-03-20 DIAGNOSIS — Y999 Unspecified external cause status: Secondary | ICD-10-CM | POA: Diagnosis not present

## 2018-03-20 DIAGNOSIS — F0390 Unspecified dementia without behavioral disturbance: Secondary | ICD-10-CM | POA: Insufficient documentation

## 2018-03-20 DIAGNOSIS — G4700 Insomnia, unspecified: Secondary | ICD-10-CM | POA: Diagnosis not present

## 2018-03-20 DIAGNOSIS — Z87891 Personal history of nicotine dependence: Secondary | ICD-10-CM | POA: Insufficient documentation

## 2018-03-20 DIAGNOSIS — Z7982 Long term (current) use of aspirin: Secondary | ICD-10-CM | POA: Diagnosis not present

## 2018-03-20 DIAGNOSIS — M810 Age-related osteoporosis without current pathological fracture: Secondary | ICD-10-CM | POA: Diagnosis not present

## 2018-03-20 DIAGNOSIS — S0990XA Unspecified injury of head, initial encounter: Secondary | ICD-10-CM | POA: Diagnosis present

## 2018-03-20 DIAGNOSIS — W19XXXA Unspecified fall, initial encounter: Secondary | ICD-10-CM | POA: Insufficient documentation

## 2018-03-20 DIAGNOSIS — W1839XA Other fall on same level, initial encounter: Secondary | ICD-10-CM | POA: Insufficient documentation

## 2018-03-20 LAB — COMPREHENSIVE METABOLIC PANEL
ALBUMIN: 3.3 g/dL — AB (ref 3.5–5.0)
ALT: 11 U/L (ref 0–44)
ANION GAP: 4 — AB (ref 5–15)
AST: 19 U/L (ref 15–41)
Alkaline Phosphatase: 63 U/L (ref 38–126)
BILIRUBIN TOTAL: 0.6 mg/dL (ref 0.3–1.2)
BUN: 22 mg/dL (ref 8–23)
CO2: 32 mmol/L (ref 22–32)
Calcium: 8.7 mg/dL — ABNORMAL LOW (ref 8.9–10.3)
Chloride: 107 mmol/L (ref 98–111)
Creatinine, Ser: 1.14 mg/dL (ref 0.61–1.24)
GFR calc Af Amer: >60 mL/min (ref >60)
GFR, EST NON AFRICAN AMERICAN: 55 mL/min — AB (ref >60)
GLUCOSE: 123 mg/dL — AB (ref 70–99)
POTASSIUM: 4.2 mmol/L (ref 3.5–5.1)
Sodium: 143 mmol/L (ref 135–145)
TOTAL PROTEIN: 6.8 g/dL (ref 6.5–8.1)

## 2018-03-20 LAB — PROTIME-INR
INR: 0.99
PROTHROMBIN TIME: 13 s (ref 11.4–15.2)

## 2018-03-20 LAB — CBC
HCT: 34.8 % — ABNORMAL LOW (ref 40.0–52.0)
HEMOGLOBIN: 11.7 g/dL — AB (ref 13.0–18.0)
MCH: 31.7 pg (ref 26.0–34.0)
MCHC: 33.5 g/dL (ref 32.0–36.0)
MCV: 94.7 fL (ref 80.0–100.0)
Platelets: 158 10*3/uL (ref 150–440)
RBC: 3.68 MIL/uL — ABNORMAL LOW (ref 4.40–5.90)
RDW: 13.8 % (ref 11.5–14.5)
WBC: 7.2 10*3/uL (ref 3.8–10.6)

## 2018-03-20 LAB — APTT: APTT: 32 s (ref 24–36)

## 2018-03-20 MED ORDER — HYDRALAZINE HCL 20 MG/ML IJ SOLN: 10.0000 mg | Freq: Once | INTRAMUSCULAR | Status: DC

## 2018-03-20 NOTE — ED Notes (Signed)
Pt in ct scan  

## 2018-03-20 NOTE — ED Provider Notes (Addendum)
[facility] Emergency Department Provider Note   ____________________________________________    I have reviewed the triage vital signs and the nursing notes.   HISTORY  Chief Complaint Fall     HPI Jose Allen is a 84 y.o. male who presents after a fall.  Patient with a history of significant dementia presents after mechanical fall where he fell backwards and struck the back of his head on the floor.  This was apparently witnessed by his daughter.  She reports he is at his baseline currently.  Not on any blood thinners.  Patient has no complaints   Past Medical History:  Diagnosis Date  . Age-related osteoporosis without current pathological fracture   . Benign prostatic hyperplasia without lower urinary tract symptoms   . Constipation   . Dysphagia, oral phase   . Gastro-esophageal reflux disease without esophagitis   . Hyperlipidemia   . Insomnia, unspecified   . Major depressive disorder, single episode, unspecified   . Muscle weakness (generalized)   . Pain, unspecified   . Renal disorder   . Vitamin D deficiency, unspecified     Patient Active Problem List   Diagnosis Date Noted  . Hyponatremia 12/15/2017    History reviewed. No pertinent surgical history.  Prior to Admission medications   Medication Sig Start Date End Date Taking? Authorizing Provider  acetaminophen (TYLENOL) 500 MG tablet Take 1,000 mg by mouth 2 (two) times daily.   Yes [provider]  aspirin EC 81 MG tablet Take 81 mg by mouth daily.   Yes [provider]  carvedilol (COREG) 25 MG tablet Take 25 mg by mouth daily.   Yes [provider]  cloNIDine (CATAPRES) 0.1 MG tablet Take 0.1 mg by mouth daily as needed. For bp >170.   Yes [provider]  donepezil (ARICEPT) 5 MG tablet Take 5 mg by mouth at bedtime.   Yes [provider]  feeding supplement, ENSURE ENLIVE, (ENSURE ENLIVE) LIQD Take 237 mLs by mouth 2  (two) times daily between meals. 12/18/17  Yes Enedina FinnerPatel, Sona, MD  finasteride (PROSCAR) 5 MG tablet Take 5 mg by mouth daily.   Yes [provider]  hydrALAZINE (APRESOLINE) 10 MG tablet Take 10 mg by mouth 3 (three) times daily.   Yes [provider]  isosorbide mononitrate (IMDUR) 30 MG 24 hr tablet Take 3 tablets (90 mg total) by mouth daily. 12/18/17  Yes Enedina FinnerPatel, Sona, MD  lactulose (CHRONULAC) 10 GM/15ML solution Take 20 g by mouth daily as needed for mild constipation.    Yes [provider]  lamoTRIgine (LAMICTAL) 25 MG tablet Take 25 mg by mouth 2 (two) times daily.   Yes [provider]  mineral oil-hydrophilic petrolatum (AQUAPHOR) ointment Apply 1 application topically at bedtime. Apply to bilateral lower legs and feet   Yes [provider]  polyethylene glycol (MIRALAX / GLYCOLAX) packet Take 17 g by mouth daily.   Yes [provider]  QUEtiapine (SEROQUEL) 25 MG tablet Take 25 mg by mouth at bedtime.   Yes [provider]  solifenacin (VESICARE) 10 MG tablet Take 1 tablet (10 mg total) by mouth daily. 04/15/17  Yes Jerilee FieldEskridge, Matthew, MD  traZODone (DESYREL) 50 MG tablet Take 50 mg by mouth at bedtime.    Yes [provider]     Allergies Terazosin; Depakote [divalproex sodium]; Enalapril; Flomax [tamsulosin hcl]; Hydrochlorothiazide; and Metoprolol  History reviewed. No pertinent family history.  Social History Social History  Tobacco Use  . Smoking status: Former Smoker    Types: Pipe  . Smokeless tobacco: Never Used  Substance Use Topics  . Alcohol use: Not Currently  . Drug use: Never    Level 5 caveat: Unable to obtain review of Systems due to dementia     ____________________________________________   PHYSICAL EXAM:  VITAL SIGNS: ED Triage Vitals  Enc Vitals Group     BP 03/20/18 1305 130/78     Pulse Rate 03/20/18 1305 60     Resp 03/20/18 1305 18     Temp 03/20/18 1305 98.1 F (36.7  C)     Temp Source 03/20/18 1305 Oral     SpO2 03/20/18 1305 100 %     Weight 03/20/18 1306 81.6 kg (180 lb)     Height 03/20/18 1306 1.676 m (5\' 6" )     Head Circumference --      Peak Flow --      Pain Score --      Pain Loc --      Pain Edu? --      Excl. in GC? --     Constitutional: Alert. No acute distress.  Eyes: Conjunctivae are normal.  Head: Approximately 4 x 4 centimeter hematoma posterior left scalp, no bleeding Nose: No swelling or bleeding Mouth/Throat: Mucous membranes are moist.   Neck:  Painless ROM no vertebral tenderness to palpation cardiovascular: Normal rate, regular rhythm. Grossly normal heart sounds.  Good peripheral circulation. Respiratory: Normal respiratory effort.  No retractions.  Gastrointestinal: Soft and nontender. No distention.  Musculoskeletal:  Warm and well perfused, moves all tremors well.  No pain with axial load.  No chest wall tenderness to palpation.  Neurologic:  Normal speech and language. No gross focal neurologic deficits are appreciated.  Skin:  Skin is warm, dry and intact. Psychiatric: Mood and affect are normal. Speech and behavior are normal.  ____________________________________________   LABS (all labs ordered are listed, but only abnormal results are displayed)  Labs Reviewed  COMPREHENSIVE METABOLIC PANEL - Abnormal; Notable for the following components:      Result Value   Glucose, Bld 123 (*)    Calcium 8.7 (*)    Albumin 3.3 (*)    GFR calc non Af Amer 55 (*)    Anion gap 4 (*)    All other components within normal limits  CBC - Abnormal; Notable for the following components:   RBC 3.68 (*)    Hemoglobin 11.7 (*)    HCT 34.8 (*)    All other components within normal limits  APTT  PROTIME-INR   ____________________________________________  EKG None ____________________________________________  RADIOLOGY  Radiologist called and notified me that CT head demonstrates subarachnoid hemorrhage in the left  paramidline vertex, ____________________________________________   PROCEDURES  Procedure(s) performed: No  Procedures   Critical Care performed:yes  CRITICAL CARE Performed by: Jene Everyobert Kinberly Perris   Total critical care time:30 minutes  Critical care time was exclusive of separately billable procedures and treating other patients.  Critical care was necessary to treat or prevent imminent or life-threatening deterioration.  Critical care was time spent personally by me on the following activities: development of treatment plan with patient and/or surrogate as well as nursing, discussions with consultants, evaluation of patient's response to treatment, examination of patient, obtaining history from patient or surrogate, ordering and performing treatments and interventions, ordering and review of laboratory studies, ordering and review of radiographic studies, pulse oximetry and re-evaluation of patient's condition.  ____________________________________________  INITIAL IMPRESSION / ASSESSMENT AND PLAN / ED COURSE  Pertinent labs & imaging results that were available during my care of the patient were reviewed by me and considered in my medical decision making (see chart for details).  Patient presents after mechanical fall with hematoma to the back of the head.  Overall well-appearing, no other injuries noted on exam.  He appears to be at his baseline which is pleasantly demented.  Called by radiologist and notified of subarachnoid hemorrhage on CT scan.  Discussed extensively with family including daughter regarding possibly rescanning the patient here in the emergency department after 6 hours but they are uncomfortable with this and would like transfer to [facility] [place] [facility] where their neurologist is  ----------------------------------------- 2:50 PM on 03/20/2018 -----------------------------------------  [facility] transfer center contacted  Discussed with [facility] neurosurgery who recommends 6-hour  repeat CT scan and if no change appropriate for discharge.  Discussed this with family and they agree with this plan    ____________________________________________   FINAL CLINICAL IMPRESSION(S) / ED DIAGNOSES  Final diagnoses:  Subarachnoid hemorrhage following injury, no loss of consciousness, initial encounter [facility])        Note:  This document was prepared using Dragon voice recognition software and may include unintentional dictation errors.    Jene Every, MD 03/20/18 1539    Jene Every, MD 03/20/18 1539

## 2018-03-20 NOTE — ED Notes (Signed)
[facility]

## 2018-03-20 NOTE — ED Notes (Signed)
Pt back to ct scan. Pt remains confused at baseline, does respond to name. Pt is alert, interacting with staff.

## 2018-03-20 NOTE — ED Triage Notes (Signed)
[facility] c/o mechanical fall. EMS report pt was walking back to room from dining hall and tripped. Hit back of head, hematoma noted. Pt has hx dementia, confused at baseline.

## 2018-03-20 NOTE — ED Notes (Signed)
EMTALA reviewed by this RN.  

## 2018-03-20 NOTE — ED Notes (Signed)
EDP aware of blood pressure increase

## 2018-03-20 NOTE — ED Notes (Signed)
Pt cleansed of incontinent urine and stool. Sheets changed. Pt placed into a hospital gown. [employer] blue sweat pants and green t shirt placed in bag on foot of bed.

## 2018-03-20 NOTE — ED Provider Notes (Addendum)
-----------------------------------------   8:31 PM on 03/20/2018 -----------------------------------------  Patient care assumed from Dr. Cyril LoosenKinner.  Unfortunate the patient's repeat CT scan has shown worsening of a subarachnoid hemorrhage.  I discussed the patient with the patient's daughter who would like the [facility] still [facility].  I discussed the [facility] with [facility] they [facility].  Family agreeable to this plan of care.   Minna AntisPaduchowski, Ayaan Shutes, MD 03/20/18 2035   CT scan of the neck is negative.  Patient's blood pressure has progressively increase is now 194/91 we will dose 10 mg of labetalol.  [facility] arrived [facility]   Minna AntisPaduchowski, Hamsa Laurich, MD 03/20/18 2219   Patient's manual blood pressure is 152 systolic.  We will hold off on hydralazine for now.  I discussed with [facility] if the patient's blood pressure increases over 165 they are to use hydralazine.   Minna AntisPaduchowski, Shakiera Edelson, MD 03/20/18 [phone]

## 2018-03-20 NOTE — ED Notes (Signed)
Staff assist x5 need for iv start.

## 2018-03-20 NOTE — ED Notes (Signed)
Daughter Jose Allen called to check on her father. Update given. Pt should be going to CT in about an hour and we will call her with results. [phone]

## 2018-03-23 MED FILL — Hydralazine HCl Inj 20 MG/ML: INTRAMUSCULAR | Qty: 1 | Status: AC

## 2018-05-05 DEATH — deceased

## 2018-05-25 ENCOUNTER — Ambulatory Visit: Payer: Self-pay

## 2019-05-19 IMAGING — CT CT CERVICAL SPINE W/O CM
3 of 7 series · 11 of 33 positions shown, 12 images · non-contrast
Comparison: Head CT January 14, 2017; cervical spine CT December 09, 2006

CLINICAL DATA: Pain following fall.  Disorientation

EXAM:
CT HEAD WITHOUT CONTRAST
CT CERVICAL SPINE WITHOUT CONTRAST
TECHNIQUE: Multidetector CT imaging of the head and cervical spine was
performed following the standard protocol without intravenous
contrast. Multiplanar CT image reconstructions of the cervical spine
were also generated.

[Series 8: coronal soft tissue · coronal · 0.30mm/px · 3 of 69 slices shown]
[im 18/69  bone]
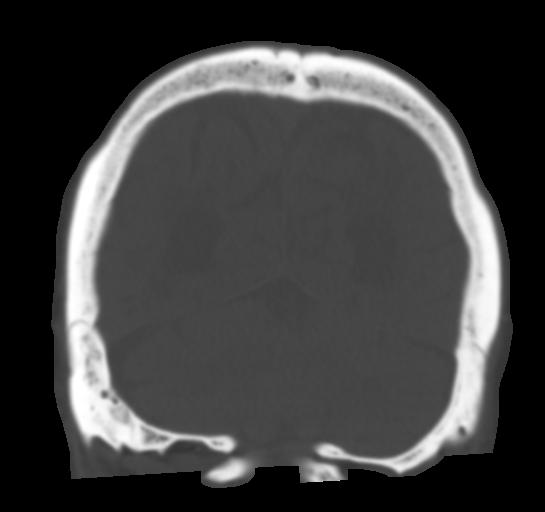
[im 35/69  bone]
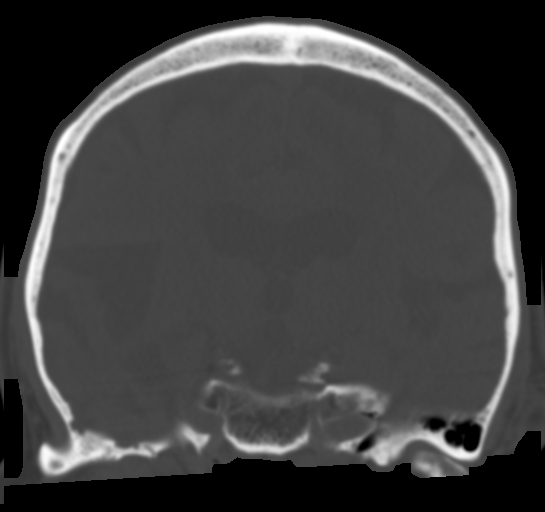
[im 52/69  bone]
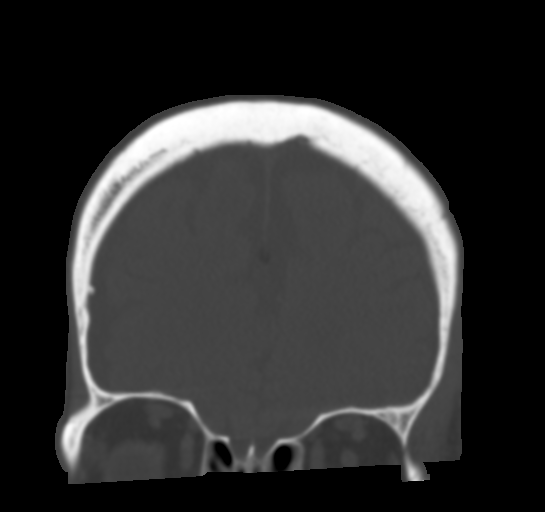

[Series 10: sagittal bone · sagittal · 0.21mm/px · 5 of 65 slices shown]
[im 11/65  bone]
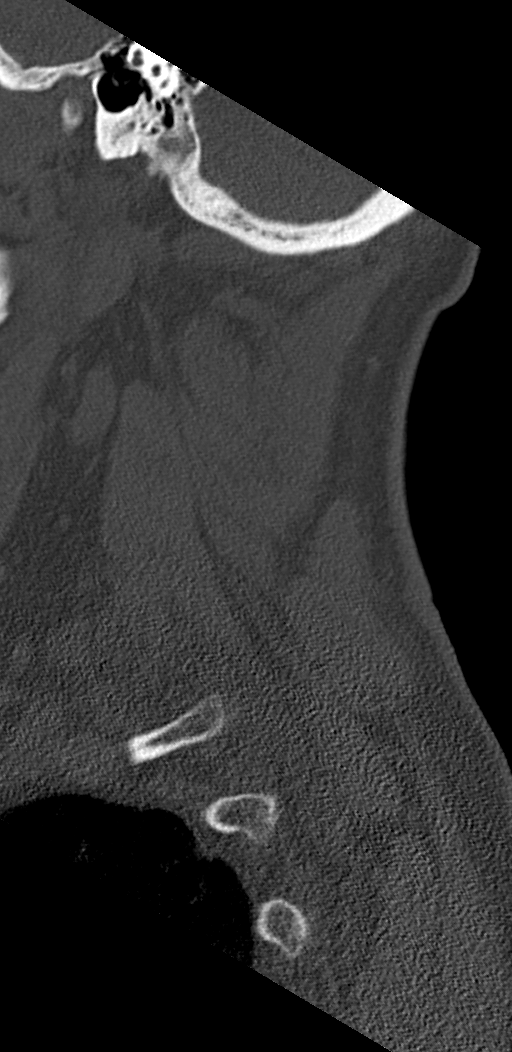
[im 22/65  bone]
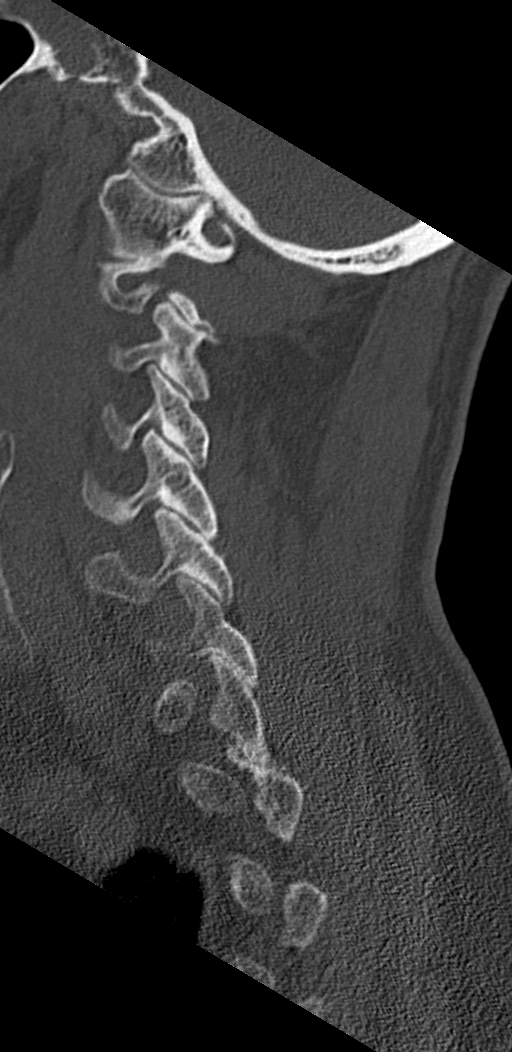
[im 33/65  bone]
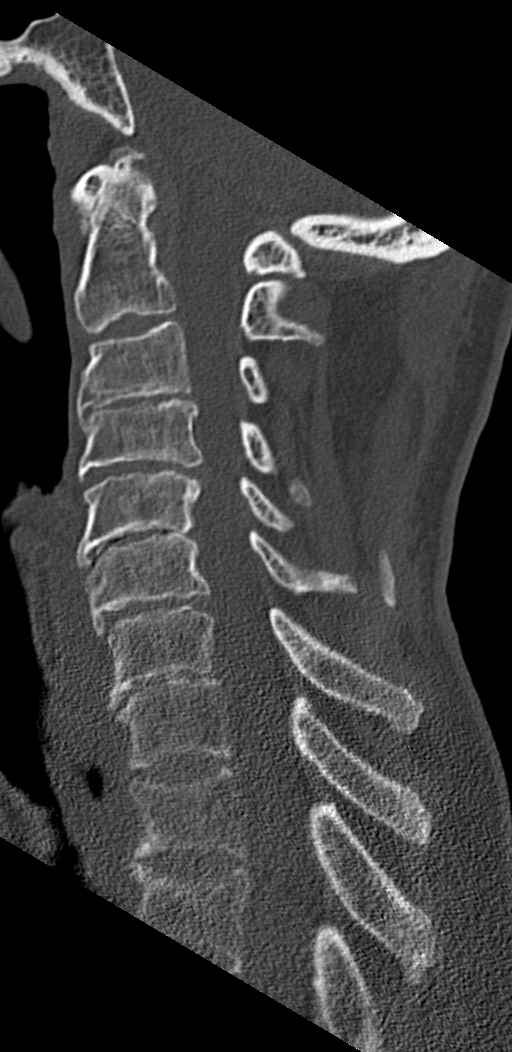
[im 43/65  bone]
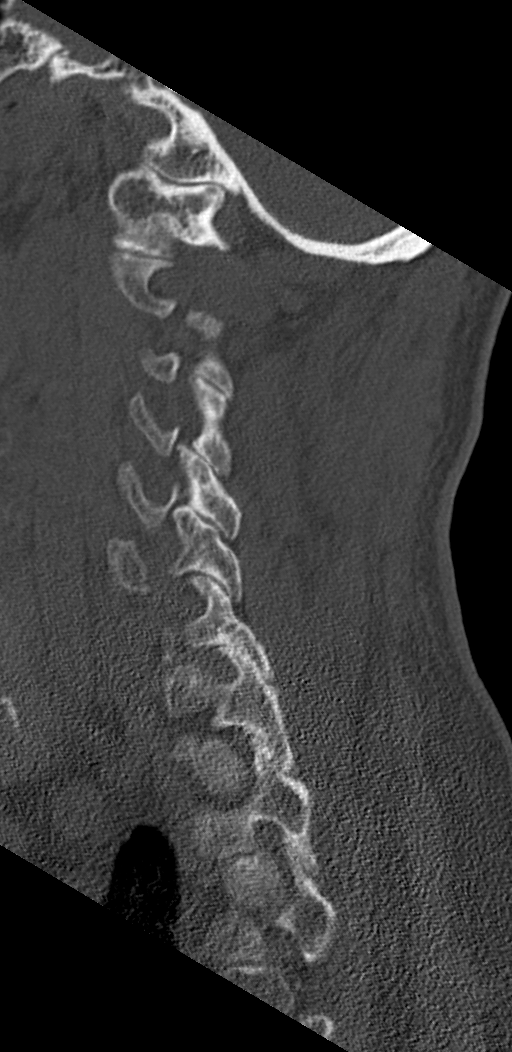
[im 54/65  bone]
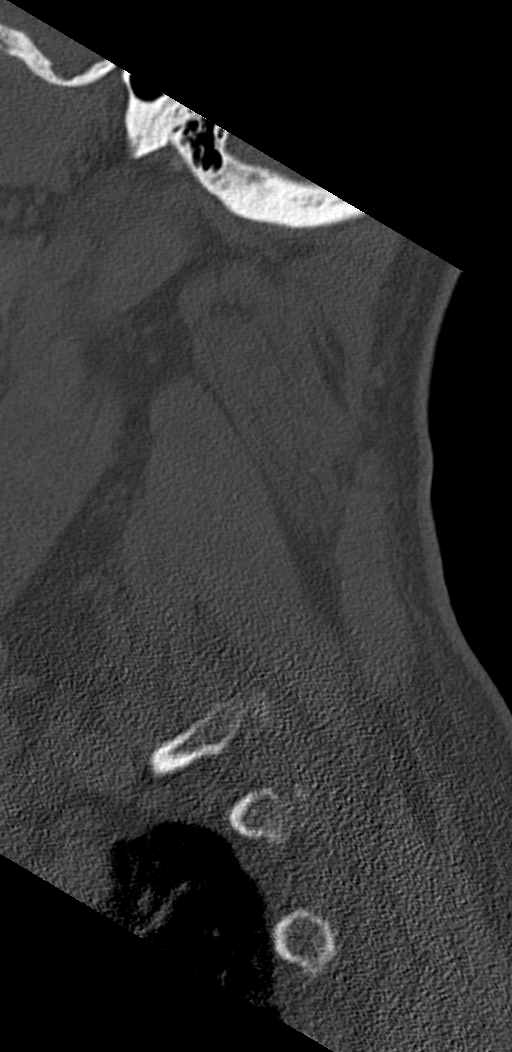

[Series 12: orthogonal bone · axial · 0.21mm/px · z∈[+224,+354]mm · 3 of 114 slices shown, 4 images]
[im 19/114  soft-tissue]
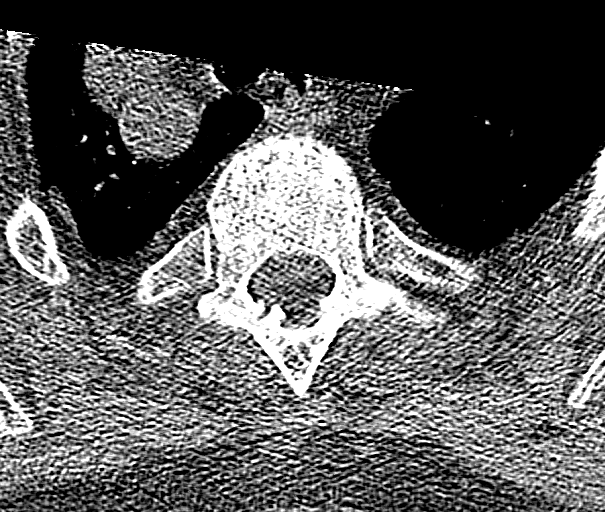
[im 19/114  bone]
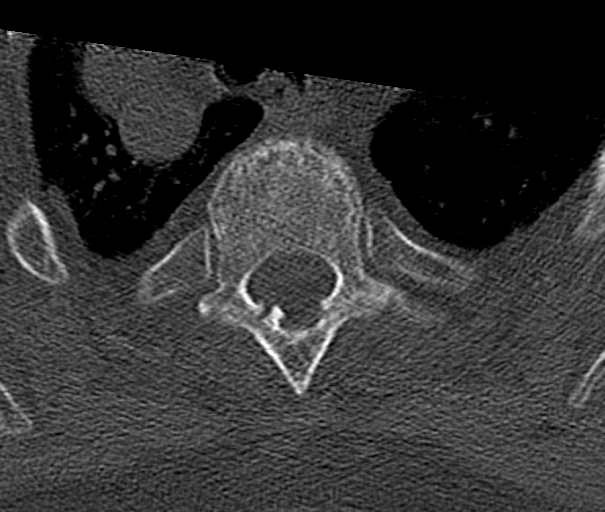
[im 57/114  bone]
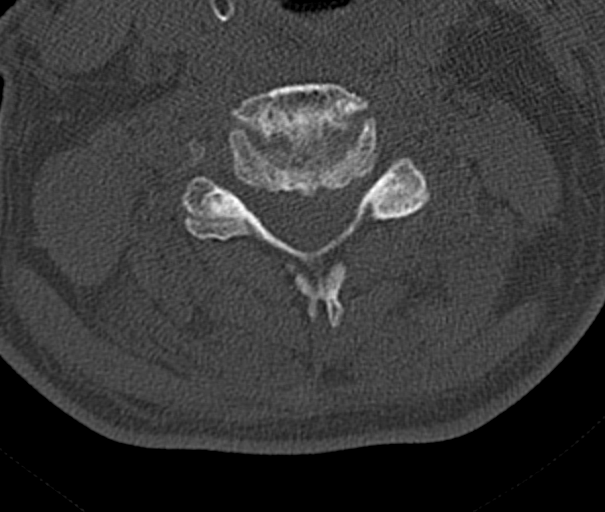
[im 95/114  bone]
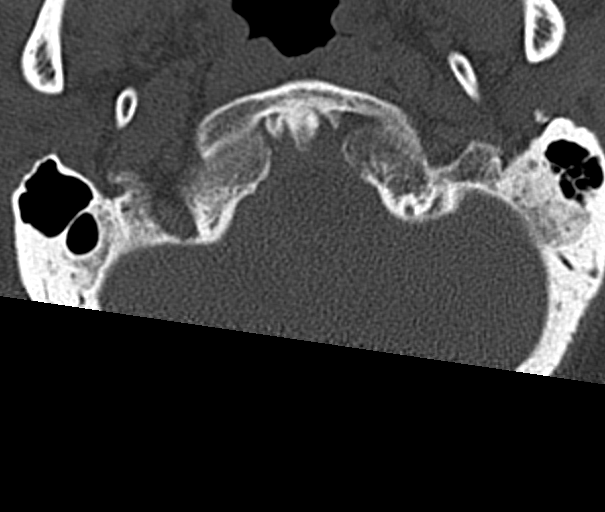

[11 of 33 positions shown; findings below may reference images not displayed]

FINDINGS: CT HEAD FINDINGS

Brain: There is moderate diffuse atrophy. There is no intracranial
mass, hemorrhage, extra-axial fluid collection, or midline shift.
There is patchy small vessel disease in the centra semiovale
bilaterally. There is a prior focal infarct in the posterior left
parietal lobe, present previously. There is no new gray-white
compartment lesion. No acute infarct evident.

Vascular: There is no appreciable hyperdense vessel. There is slight
calcification in the left carotid siphon.

Skull: There is a right frontal scalp hematoma. Bony calvarium
appears intact.

Sinuses/Orbits: There is opacification in multiple ethmoid air
cells. Visualized paranasal sinuses elsewhere are clear. Visualized
orbits appear symmetric bilaterally.

Other: Mastoid air cells are clear.

CT CERVICAL SPINE FINDINGS

Alignment: There is slight cervical levoscoliosis. There is no
appreciable spondylolisthesis.

Skull base and vertebrae: Skull base and craniocervical junction
regions appear normal. There is no appreciable fracture. Bones are
osteoporotic. There are no blastic or lytic bone lesions.

Soft tissues and spinal canal: Prevertebral soft tissues and
predental space regions are normal. There are no paraspinous
lesions. No cord or canal hematoma is evident.

Disc levels: There is severe disc space narrowing at C3-4, C5-6, and
C7-T1. There is moderate disc space narrowing at C4-5 and C6-7.
There is milder disc space narrowing at C2-3. There is multifocal
facet osteoarthritic change. Exit foraminal narrowing to varying
degrees is noted at all levels bilaterally. There is no disc
extrusion. There is mild spinal stenosis at C4-5 due to bony
hypertrophy.

Upper chest: Visualized upper lung zones appear clear.

Other: There is enlargement of the right lobe of the thyroid
compared to the left with foci of calcification in the right lobe of
the thyroid. There is an ill-defined nodular opacity in the right
lobe of the thyroid measuring 2.4 x 1.7 cm.
IMPRESSION: CT head: Stable atrophy with patchy supratentorial small vessel
disease. Prior focal infarct in the high posterior left parietal
lobe. No intracranial mass, hemorrhage, or extra-axial fluid
collection. No acute infarct evident.

Right frontal scalp hematoma. Areas of ethmoid sinus disease
bilaterally.

CT cervical spine: No fracture or spondylolisthesis. Multifocal
arthropathy. Mild spinal stenosis at C4-5 due to bony hypertrophy.

**An incidental finding of potential clinical significance has been
found. Dominant nodular lesion right lobe of thyroid with asymmetric
enlargement of the right lobe and calcification in the right lobe.
Consider further evaluation with thyroid ultrasound nonemergently.
If patient is clinically hyperthyroid, consider nuclear medicine
thyroid uptake and scan.**
